# Patient Record
Sex: Female | Born: 1975 | Race: White | Hispanic: No | Marital: Married
Health system: Midwestern US, Community
[De-identification: ages and names within clinical notes are randomized; demographics above are authoritative.]

## PROBLEM LIST (undated history)

## (undated) DIAGNOSIS — Z1211 Encounter for screening for malignant neoplasm of colon: Secondary | ICD-10-CM

---

## 2014-07-06 ENCOUNTER — Inpatient Hospital Stay

## 2014-07-09 NOTE — Progress Notes (Signed)
Physical Therapy  Initial Assessment  Date: 07/09/2014  Patient Name: Mckenzie Robinson  MRN: [id number]  DOB: [birth year]     Treatment Diagnosis: Sacroilitis    Restrictions       Subjective   General  Chart Reviewed: Yes  Referring Practitioner: [name]  Referral Date : 06/22/14  Diagnosis: Sacroilitis  Follows Commands: Within Functional Limits  General Comment  Comments: see intake  forms  PT Visit Information  Onset Date: 07/09/12  PT Insurance Information: anthem  Total # of Visits Approved: 12  Subjective  Subjective: pt reports a  longhistory of right sided knee, hip and lateral thisgh pain since giving birth to first child 6 years ago.  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  Pt reports that right posterior lateral hip, thigh and calf sxs have progressd in past several months.  Described as a ache/burn that is present with all physical activity for the first t30 min then decreases.  Pt is unaware of any injury.  increased sxs with FF, squatting adn lunge motion patterns. Denies numbness or tingling, bowel or baldder sxs, no sleeping issues.  Pain Screening  Patient Currently in Pain: Yes  Pain Assessment  Pain Assessment: 0-10  Pain Level: 5  Patient's Stated Pain Goal: No pain  Vital Signs  Patient Currently in Pain: Yes  Vision/Hearing     Home Living     Orientation     Objective     Observation/Palpation  Posture: Good  Palpation: TTP of the right psoas, right posterior lateral gluteal comlpex at the GT of the right hip.  Observation: right creased low, inflair right  RLE (degrees)  RLE ROM: WFL;Exceptions  R Hip Flexion 0-125: pasive hip flex - 110  R Hip External Rotation 0-45: , seated hip ER - 35 deg.  ROM LLE (degrees)  LLE ROM: WFL  Spine  Lumbar: mild decreased reversal of the lumbar curve in FF  Special Tests: VCT: 3+/5 - yeild right anterior hips, neg. slump, neg. SLR  Joint Mobility  Spine: WNL, right SIJD, left  sacral torsion.  ROM RLE: decreased R hip ER motion pattern and posterior glide.  Strength RLE  Strength RLE: WFL  Comment: PGM: 3+/5, seated hip ER 3+/5  Strength Other  Other: supine psoas - right poor trunk response, left good.     Additional Measures  Flexibility: neg. Ober, neg. T-test bilat.   Other: PHe test: right altered firing pattern, left is WNL.  sig delay right gluteal as to the left.  Sensation  Overall Sensation Status: WNL (increased sensitivity right ITB)        Ambulation  Ambulation?: Yes  Ambulation 1  Quality of Gait: pt with decreased propulsion pattern, right withincreased ext rotation motion pattern as to the left, right "blocked " at the hip complex                            Assessment   Assessment  Assessment: Decreased functional mobility ;Decreased strength;Decreased endurance;Decreased ADL status;Decreased high-level IADLs;Decreased ROM  Assessment: pt presnets with altered muscle activation and firing pattern of the right pelvisa dn Le complex with altered biomechancis of the right SIJ, hip  Treatment Diagnosis: Sacroilitis  Prognosis: Good  Time In: 1100  Time Out: 1200  Timed Code Treatment Minutes: 60 Minutes  Total Treatment Time: 60  Activity Tolerance  Activity Tolerance: Patient Tolerated treatment well  Plan  Plan: Plan of care initiated         Plan   Plan  Plan: Plan of care initiated  Progress Note  See Progress Note: Yes  Timed Code Treatment Minutes: 60 Minutes  Total Treatment Time: 60  Frequency and duration of tx  Days: 2  Weeks: 6    G-Code       Goals  Short term goals  Time Frame for Short term goals: 1-3 weeks   Short term goal 1: independant with HEP  Short term goal 2: decreased pain to 2/10  Short term goal 3: no biomechanical deficit fo the pelvis and hip complex  Short term goal 4: increased MMT fo the PGM, seated hip ER to 4+/5  Short term goal 5: normal firing pattern right with supine psoas adn PHE  Long term goals  Time Frame for Long term goals : 3-6  weeks Pt  Long term goal 1: No pain  Long term goal 2: return to running and work out [employer] or limitation  Long term goal 3: normal VCT  Long term goal 4: Gait to be equal left to right with no ext. rotation findings on rgith stance       Gilford Raid, PT, OCS, FAAOMPT

## 2014-07-09 NOTE — Other (Signed)
Physical Therapy Daily Treatment Note    Date:  07/09/2014    Patient Name:  Mckenzie Robinson    DOB:  01-18-1976  MRN: [id number]  Restrictions/Precautions:    Medical/Treatment Diagnosis Information:  ?? Diagnosis: Sacroilitis  Insurance/Certification information:  PT Insurance Information: anthem  Physician Information:  Referring Practitioner: [name]  Plan of care signed (Y/N):    Visit# / total visits:  1/12         Time in:  11:00      Timed Treatment: 60 Total Treatment Time:  60  ________________________________________________________________________________________    Pain Level:    5/10  SUBJECTIVE:  See eval    OBJECTIVE: see eval    Exercise/Equipment Resistance/Repetitions Other comments     Clam #1                                                                                                                                        Other Therapeutic Activities:      Manual Treatments:     Right Stoddard (cavitation with positioning) left sacral torsion correction sustained grade 4, hit tech right GT  Modalities:      Test/Measurements:         ASSESSMENT:     Pt with increased PGM and seated hip ER post MT - graded near 4+/5    Treatment/Activity Tolerance:   [x]  Patient tolerated treatment well []  Patient limited by fatique  []  Patient limited by pain []  Patient limited by other medical complications  []  Other:     Goals:    Short term goals  Time Frame for Short term goals: 1-3 weeks   Short term goal 1: independant with HEP  Short term goal 2: decreased pain to 2/10  Short term goal 3: no biomechanical deficit fo the pelvis and hip complex  Short term goal 4: increased MMT fo the PGM, seated hip ER to 4+/5  Short term goal 5: normal firing pattern right with supine psoas adn PHE     Long term goals  Time Frame for Long term goals : 3-6 weeks Pt  Long term goal 1: No pain  Long term goal 2: return to running and work out [employer] sxs or limitation  Long term goal 3: normal VCT  Long term goal 4:  Gait to be equal left to right with no ext. rotation findings on rgith stance     Plan: []  Continue per plan of care []  Alter current plan (see comments)   [x]  Plan of care initiated []  Hold pending MD visit []  Discharge      Plan for Next Session:  Correction of biomechanical dysfunctions as they present on visit.  Restore proper motor firing pattern and functional muscle activation as presented on visit.  Progress as tolerates.    Re-Certification Due Date:         Signature:  Tyrell AntonioDave S  Teondra Newburg, PT, OCS, FAAOMPT

## 2014-07-24 ENCOUNTER — Inpatient Hospital Stay

## 2014-07-24 NOTE — Other (Signed)
Physical Therapy Daily Treatment Note    Date:  07/24/2014    Patient Name:  Mckenzie Robinson    DOB:  06/08/1975  MRN: [id number]  Restrictions/Precautions:    Medical/Treatment Diagnosis Information:     Insurance/Certification information:     Physician Information:  Referring Practitioner: [name]  Plan of care signed (Y/N):    Visit# / total visits:  2/12         Time in:  11:00      Timed Treatment: 30 Total Treatment Time:  30  ________________________________________________________________________________________    Pain Level:     SUBJECTIVE:  Returns following being away on vacation "I did not perform HEP as instructed"  Feeling about the same.    OBJECTIVE: SIJD, right hip with decreased ROM and strength in seated ER, positive T-test     Exercise/Equipment Resistance/Repetitions Other comments     Clam #1 reviewed      Overhead stick squat     Prone fig 4 lifts                                                                                                                          Other Therapeutic Activities:      Manual Treatments:     Right Stoddard (cavitation with positioning) left sacral torsion correction sustained grade 4, hit tech right GT  Modalities:      Test/Measurements:         ASSESSMENT:     Pt with improved multi joint motion pattern post treatment - had presented with sig quad activation pattern.    Treatment/Activity Tolerance:   [x]  Patient tolerated treatment well []  Patient limited by fatique  []  Patient limited by pain []  Patient limited by other medical complications  []  Other:     Goals:                Plan: [x]  Continue per plan of care []  Alter current plan (see comments)   []  Plan of care initiated []  Hold pending MD visit []  Discharge      Plan for Next Session:  Correction of biomechanical dysfunctions as they present on visit.  Restore proper motor firing pattern and functional muscle activation as presented on visit.  Progress as tolerates.    Re-Certification Due  Date:         Signature:  Gilford Raidave S Hondo Nanda, PT, OCS, FAAOMPT

## 2014-07-27 ENCOUNTER — Inpatient Hospital Stay

## 2014-07-27 NOTE — Other (Signed)
Physical Therapy Daily Treatment Note    Date:  07/27/2014    Patient Name:  Mckenzie Robinson    DOB:  28-Jul-1975  MRN: [id number]  Restrictions/Precautions:    Medical/Treatment Diagnosis Information:     Insurance/Certification information:     Physician Information:  Referring Practitioner: [name]  Plan of care signed (Y/N):    Visit# / total visits: 3/12         Time in:  1:00      Timed Treatment: 30 Total Treatment Time:  30  ________________________________________________________________________________________    Pain Level:     SUBJECTIVE:  States that feeling the same - ran 5 miles yesterday with same sxs.    OBJECTIVE:     Exercise/Equipment Resistance/Repetitions Other comments   reviewed                     Standing RLE - left IR holds       Multi hip machine        TG #3 SL with hip ER and single leg squatting                                                                                              Other Therapeutic Activities:      Manual Treatments:     Right Stoddard grade 4, hit tech right GT - anterior. sTM lateral thigh/posterior glut  Modalities:      Test/Measurements:         ASSESSMENT:     Pt having difficulty limiting outside exercise to allow for proper rest period      Treatment/Activity Tolerance:   [x]  Patient tolerated treatment well []  Patient limited by fatique  []  Patient limited by pain []  Patient limited by other medical complications  []  Other:     Goals:                Plan: [x]  Continue per plan of care []  Alter current plan (see comments)   []  Plan of care initiated []  Hold pending MD visit []  Discharge      Plan for Next Session:  Correction of biomechanical dysfunctions as they present on visit.  Restore proper motor firing pattern and functional muscle activation as presented on visit.  Progress as tolerates.    Re-Certification Due Date:         Signature:  Gilford Raidave S Agamjot Kilgallon, PT, OCS, FAAOMPT

## 2014-07-30 ENCOUNTER — Inpatient Hospital Stay

## 2014-07-30 NOTE — Other (Signed)
Physical Therapy Daily Treatment Note    Date:  07/30/2014    Patient Name:  Mckenzie Robinson    DOB:  1976-01-28  MRN: [id number]  Restrictions/Precautions:    Medical/Treatment Diagnosis Information:     Insurance/Certification information:     Physician Information:  Referring Practitioner: Gloris Ham  Plan of care signed (Y/N):    Visit# / total visits: 4/12         Time in:  11:00      Timed Treatment: 30 Total Treatment Time:  30  ________________________________________________________________________________________    Pain Level:     SUBJECTIVE:  Reports that having no change in upper hip "soreness"  No lateral thigh or knee pain      OBJECTIVE:     Exercise/Equipment Resistance/Repetitions Other comments   reviewed                                                                                                                        Other Therapeutic Activities:      Manual Treatments:     hit tech right GT - anterior. STM lateral thigh/posterior glut , right inflair MET, AI MET    Modalities:      Test/Measurements:         ASSESSMENT:     Pt noted decreased sxs - remains with soreness upper gluteal - glut med.      Treatment/Activity Tolerance:   '[x]'  Patient tolerated treatment well '[]'  Patient limited by fatique  '[]'  Patient limited by pain '[]'  Patient limited by other medical complications  '[]'  Other:     Goals:                Plan: '[x]'  Continue per plan of care '[]'  Alter current plan (see comments)   '[]'  Plan of care initiated '[]'  Hold pending MD visit '[]'  Discharge      Plan for Next Session:  Correction of biomechanical dysfunctions as they present on visit.  Restore proper motor firing pattern and functional muscle activation as presented on visit.  Progress as tolerates.    Re-Certification Due Date:         Signature:  Dion Body, PT, OCS, FAAOMPT

## 2014-08-01 ENCOUNTER — Inpatient Hospital Stay

## 2014-08-01 NOTE — Other (Signed)
Physical Therapy Daily Treatment Note    Date:  08/01/2014    Patient Name:  Mckenzie Robinson    DOB:  11/24/1975  MRN: [id number]  Restrictions/Precautions:    Medical/Treatment Diagnosis Information:     Insurance/Certification information:     Physician Information:  Referring Practitioner: Gloris Ham  Plan of care signed (Y/N):    Visit# / total visits: 5/12         Time in:  10:30      Timed Treatment: 40 Total Treatment Time:  40  ________________________________________________________________________________________    Pain Level:     SUBJECTIVE:  States that getting better- able to run full distance without having to stop, prior had to stop half way through.      OBJECTIVE: Right PI, left sacral torsion, TTP right proximal gluteal/hip complex    Exercise/Equipment Resistance/Repetitions Other comments   reviewed                                        Roller ITB/piriformis     Roller protocol        Hip ER - PNF, sustained holds                                                               Other Therapeutic Activities:      Manual Treatments:     left Stoddard grade 4,  left sacral torsion correction sustained grade 4, hit tech right GT - anterior and posterior. STM lateral thigh/posterior glut , right inflair MET,     Modalities:      Test/Measurements:         ASSESSMENT:    Progressing well -       Treatment/Activity Tolerance:   [x] Patient tolerated treatment well [] Patient limited by fatique  [] Patient limited by pain [] Patient limited by other medical complications  [] Other:     Goals:                Plan: [x] Continue per plan of care [] Alter current plan (see comments)   [] Plan of care initiated [] Hold pending MD visit [] Discharge      Plan for Next Session:  Correction of biomechanical dysfunctions as they present on visit.  Restore proper motor firing pattern and functional muscle activation as presented on visit.  Progress as tolerates.    Re-Certification Due Date:         Signature:   Dion Body, PT, OCS, FAAOMPT

## 2014-08-06 ENCOUNTER — Inpatient Hospital Stay

## 2014-08-06 NOTE — Other (Signed)
Physical Therapy Daily Treatment Note    Date:  08/06/2014    Patient Name:  Mckenzie Robinson    DOB:  1975/06/10  MRN: [id number]  Restrictions/Precautions:    Medical/Treatment Diagnosis Information:     Insurance/Certification information:     Physician Information:  Referring Practitioner: [name]  Plan of care signed (Y/N):    Visit# / total visits: 6/12         Time in:  11:00      Timed Treatment: 30 Total Treatment Time:  30  ________________________________________________________________________________________    Pain Level:     SUBJECTIVE:  Reports that having decreased sxs overall but sxs remains especially after running and spinning for 50 minutes.        OBJECTIVE: remains with decreased mobility of the femoral head (right) and altered sacral position    Exercise/Equipment Resistance/Repetitions Other comments   TG in left side lying and hip ER single leg press     Ball [place] lifts                                               Hip ER - PNF, sustained holds                                                               Other Therapeutic Activities:      Manual Treatments:     left sacral torsion correction sustained grade 4, hit (anterior) tech right GT  STM lateral thigh/posterior glut ,      Modalities:      Test/Measurements:         ASSESSMENT:    Progressing well - see progress note      Treatment/Activity Tolerance:   [x]  Patient tolerated treatment well []  Patient limited by fatique  []  Patient limited by pain []  Patient limited by other medical complications  []  Other:     Goals:                Plan: [x]  Continue per plan of care []  Alter current plan (see comments)   []  Plan of care initiated []  Hold pending MD visit []  Discharge      Plan for Next Session:  Correction of biomechanical dysfunctions as they present on visit.  Restore proper motor firing pattern and functional muscle activation as presented on visit.  Progress as tolerates.    Re-Certification Due Date:         Signature:   Gilford Raidave S Zaccheus Edmister, PT, OCS, FAAOMPT

## 2014-08-06 NOTE — Progress Notes (Signed)
Outpatient Physical Therapy  Phone: [phone] Fax: [phone]     To: Dr. Dory Peruhung      From: Gilford Raidave S Kerah Hardebeck, PT, OCS, FAAOMPT    Patient: Mckenzie Robinson     DOB: [birth year]  Diagnosis: SIJD/Ritght ITB    Date: 08/06/2014       Physical Therapy Progress    Total Visits to date:  6  Cancels/No-shows to date:  0    Plan of Care/Treatment to date:  [x]  Therapeutic Exercise    []  Modalities:  []  Therapeutic Activity     []  Ultrasound   []  Electrical Stimulation   [x]  Gait Training      []  Cervical Traction   []  Lumbar Traction  [x]  Neuromuscular Re-education  []  Cold/hotpack  []  Iontophoresis  [x]  Instruction in HEP      Other:  [x]  Manual Therapy       []                                  []  Aquatic Therapy       []                                ?      Significant Findings At Last Visit/Comments:    ?? Notes improved capacity to run prior to onset of sxs.  When sxs return, they are of decreased intensity  ?? Mild sxs after spinning class of greater than 50 minutes  ?? Improved pelvic alignment and mobility on the right, remains with sacral torsion  ?? Sig. Increased right hip motion pattern and muscle activation (decreased tensor motion pattern/femoral IR)    Plan to continue to see Mrs. Bowerman for restoration of biomechanical deficits and facilitation of proper muscle activation and firing pattern of the right LE and pelvis.              Rehab Potential: []  Excellent [x]  Good []  Fair  []  Poor     Goal Status:  []  Achieved [x]  Partially Achieved  []  Not Achieved     Patient Status: [x]  Continue per initial plan of Care     []  Patient now discharged     []  Additional visits requested, Please re-certify for additional visits:      Requested frequency/duration:  X/week for weeks    Electronically signed by:  Gilford Raidave S Ephrem Carrick, PT, OCS, FAAOMPT    If you have any questions or concerns, please don't hesitate to call.  Thank you for your referral.    Physician  Signature:________________________________Date:__________________  By signing above, therapist???s plan is approved by physician

## 2014-08-10 ENCOUNTER — Inpatient Hospital Stay

## 2014-08-10 NOTE — Other (Signed)
Physical Therapy Daily Treatment Note    Date:  08/10/2014    Patient Name:  Mckenzie Robinson    DOB:  1975-06-15  MRN: [id number]  Restrictions/Precautions:    Medical/Treatment Diagnosis Information:     Insurance/Certification information:     Physician Information:  Referring Practitioner: Mckenzie Robinson  Plan of care signed (Y/N):    Visit# / total visits: 7/12         Time in:  11:10      Timed Treatment: 15 Total Treatment Time:  20  ________________________________________________________________________________________    Pain Level:     SUBJECTIVE:  (confusion with scheduling and check in on today's visit - hence shorter visit.      OBJECTIVE:     Exercise/Equipment Resistance/Repetitions Other comments                                                                                                                    Other Therapeutic Activities:      Manual Treatments:     left sacral torsion correction sustained grade 4, hit (anterior) tech right GT  STM lateral thigh/posterior glut ,  Stoddard (cavitation with positioning), lateral hip distraction grade 4, STM of the ITB and peri GT.  Modalities:      Test/Measurements:         ASSESSMENT:    Progressing well, pt on vacation for 1 week.      Treatment/Activity Tolerance:   [x]  Patient tolerated treatment well []  Patient limited by fatique  []  Patient limited by pain []  Patient limited by other medical complications  []  Other:     Goals:                Plan: [x]  Continue per plan of care []  Alter current plan (see comments)   []  Plan of care initiated []  Hold pending MD visit []  Discharge      Plan for Next Session:  Correction of biomechanical dysfunctions as they present on visit.  Restore proper motor firing pattern and functional muscle activation as presented on visit.  Progress as tolerates.    Re-Certification Due Date:         Signature:  Gilford Raidave S Georga Stys, PT, OCS, FAAOMPT

## 2014-08-10 NOTE — Progress Notes (Deleted)
Physical Therapy  Cancellation/No-show Note  Patient Name:  Mckenzie LoftMargaret M Braunschweig  DOB:  27-Nov-1975   Date:  08/10/2014  Cancels to date: 0  No-shows to date: 1    For today's appointment patient:  []  Cancelled  []  Rescheduled appointment  [x]  No-show     Reason given by patient:  []  Patient ill  []  Conflicting appointment  []  No transportation    []  Conflict with work  [x]  No reason given  []  Other:     Comments:      Electronically signed by:  Gilford Raidave S Anglia Blakley, PT

## 2014-08-22 ENCOUNTER — Inpatient Hospital Stay

## 2014-08-22 NOTE — Progress Notes (Signed)
Outpatient Physical Therapy  Phone: [phone] Fax: [phone]     To: Dr. Earlie LouJ. Chung      From: Mckenzie Robinson, PT, OCS, FAAOMPT     Patient: Mckenzie Robinson      DOB: Jun 28, 1975  Diagnosis: SIJD, ITB      Date: 08/22/2014    Physical Therapy Progress/Discharge Note    Total Visits to date:  8  Cancels/No-shows to date: 0     Plan of Care/Treatment to date:  [x]  Therapeutic Exercise    []  Modalities:  []  Therapeutic Activity     []  Ultrasound   []  Electrical Stimulation   [x]  Gait Training      []  Cervical Traction   []  Lumbar Traction  [x]  Neuromuscular Re-education  []  Cold/hotpack  []  Iontophoresis  [x]  Instruction in HEP      Other:  [x]  Manual Therapy       []                                  []  Aquatic Therapy       []                                ?            Findings:    ?? Increased ability to perform exercise activity with less frequency and intensity of lateral thigh sxs.   ?? Pelvic asymmetry with increasing ability to sustain  manual corrections - remains with on/off right Posterior innominata and left sacral torsion.  ?? Sig. Increased strength of the right short hip ER - but they remains with early fatigue leading to increased use of the tensor/ITB in high dynamic activity  ?? 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           Rehab Potential: [x]  Excellent []  Good []  Fair  []  Poor     Goal Status:  []  Achieved [x]  Partially Achieved  []  Not Achieved     Patient Status: [x]  Continue per initial plan of Care     []  Patient now discharged     [x]  Additional visits requested, Please re-certify for additional visits:      Requested frequency/duration:  1X/week for 4 weeks    Electronically signed by:  Mckenzie Raidave S Ronit Marczak, PT, OCS, FAAOMPT    If you have any questions or concerns, please don't hesitate to call.  Thank you for your referral.    Physician  Signature:________________________________Date:__________________  By signing above, therapist???s plan is approved by physician

## 2014-08-22 NOTE — Other (Signed)
Physical Therapy Daily Treatment Note    Date:  08/22/2014    Patient Name:  Mckenzie Robinson    DOB:  05/29/1975  MRN: [id number]  Restrictions/Precautions:    Medical/Treatment Diagnosis Information:   ITB   Insurance/Certification information:     Physician Information:  Referring Practitioner: [name]  Plan of care signed (Y/N):    Visit# / total visits: 9/12         Time in:  12:30      Timed Treatment: 30 Total Treatment Time:  30  ________________________________________________________________________________________    Pain Level:     SUBJECTIVE:  Reports that did not do much HEP on vacation - was able to use TM twice without sxs.      OBJECTIVE: decreased soft tissue mobility of the right muscle at the insertion of the GT.  Right PI and scaral torsion noted.    Exercise/Equipment Resistance/Repetitions Other comments                                                                                                                    Other Therapeutic Activities:      Manual Treatments:     left sacral torsion correction sustained grade 4, hit (anterior) tech right GT  STM lateral thigh/posterior glut ,  Stoddard grade 5 right, lateral hip distraction grade 4, STM of the ITB and peri GT.  Modalities:      Test/Measurements:         ASSESSMENT:    Per MD note      Treatment/Activity Tolerance:   [x]  Patient tolerated treatment well []  Patient limited by fatique  []  Patient limited by pain []  Patient limited by other medical complications  []  Other:       Plan: [x]  Continue per plan of care []  Alter current plan (see comments)   []  Plan of care initiated []  Hold pending MD visit []  Discharge      Plan for Next Session:  Correction of biomechanical dysfunctions as they present on visit.  Restore proper motor firing pattern and functional muscle activation as presented on visit.  Progress as tolerates.    Re-Certification Due Date:         Signature:  Gilford Raidave S Kert Shackett, PT, OCS, FAAOMPT

## 2014-08-30 ENCOUNTER — Encounter

## 2014-08-31 ENCOUNTER — Encounter

## 2014-09-03 ENCOUNTER — Inpatient Hospital Stay

## 2014-09-03 NOTE — Progress Notes (Signed)
New Rx dated 08-23-14 4 visits

## 2014-09-03 NOTE — Other (Signed)
Physical Therapy Daily Treatment Note    Date:  09/03/2014    Patient Name:  Mckenzie Robinson    DOB:  05-16-1976  MRN: [id number]  Restrictions/Precautions:    Medical/Treatment Diagnosis Information:   ITB   Insurance/Certification information:     Physician Information:  Referring Practitioner: [name]  Plan of care signed (Y/N):    Visit# / total visits: 9/12 (4)         Time in:  3:00     Timed Treatment: 30 Total Treatment Time:  30  ________________________________________________________________________________________    Pain Level:     SUBJECTIVE:  Reports that getting better all the time - MD happy with progress per pt.      OBJECTIVE: positive ANTT RLE    Exercise/Equipment Resistance/Repetitions Other comments                PKF with ball squeeze     3 way ball compression holds with mini bridge     Slump slidders                                                                                          Other Therapeutic Activities:      Manual Treatments:     Stoddard, positional only, standing drop , STM right gluteal complex, prone long axis distraction grade 4, sciatic nerve tracing, ANTT RLE tensioner's and sliders       Modalities:      Test/Measurements:         ASSESSMENT:    Per MD note      Treatment/Activity Tolerance:   [x]  Patient tolerated treatment well []  Patient limited by fatique  []  Patient limited by pain []  Patient limited by other medical complications  []  Other:       Plan: [x]  Continue per plan of care []  Alter current plan (see comments)   []  Plan of care initiated []  Hold pending MD visit []  Discharge      Plan for Next Session:  Correction of biomechanical dysfunctions as they present on visit.  Restore proper motor firing pattern and functional muscle activation as presented on visit.  Progress as tolerates.    Re-Certification Due Date:         Signature:  Gilford Raidave S Kanijah Groseclose, PT, OCS, FAAOMPT

## 2014-09-11 ENCOUNTER — Inpatient Hospital Stay

## 2014-09-11 NOTE — Other (Signed)
Physical Therapy Daily Treatment Note    Date:  09/11/2014    Patient Name:  Mckenzie Robinson    DOB:  10/20/1975  MRN: [id number]  Restrictions/Precautions:    Medical/Treatment Diagnosis Information:   ITB   Insurance/Certification information:     Physician Information:  Referring Practitioner: [name]  Plan of care signed (Y/N):    Visit# / total visits: 10/12 (4)         Time in:  10:30     Timed Treatment: 30 Total Treatment Time:  30  ________________________________________________________________________________________    Pain Level:     SUBJECTIVE:  Reports that overall sxs are improved no w with no sxs in the lateral thigh - primarily in the high ischial tuberosity area.  Had one episode of right calf sxs with running 4 days ago.      OBJECTIVE: right AI, positive ANTT right, positive slump test right.    Exercise/Equipment Resistance/Repetitions Other comments                        Slump slidders                                                                                          Other Therapeutic Activities:      Manual Treatments:     Stoddard grade 5, , STM right gluteal complex, ischial tuberopsity,, sciatic nerve tracing, ANTT RLE tensioner's and sliders       Modalities:      Test/Measurements:         ASSESSMENT:    Note no sxs post treatment, neg slump  test      Treatment/Activity Tolerance:   [x]  Patient tolerated treatment well []  Patient limited by fatique  []  Patient limited by pain []  Patient limited by other medical complications  []  Other:       Plan: [x]  Continue per plan of care []  Alter current plan (see comments)   []  Plan of care initiated []  Hold pending MD visit []  Discharge      Plan for Next Session:  Correction of biomechanical dysfunctions as they present on visit.  Restore proper motor firing pattern and functional muscle activation as presented on visit.  Progress as tolerates.    Re-Certification Due Date:         Signature:  Gilford Raidave S Homero Hyson, PT, OCS, FAAOMPT

## 2014-09-17 ENCOUNTER — Inpatient Hospital Stay

## 2014-09-17 NOTE — Other (Signed)
Physical Therapy Daily Treatment Note    Date:  09/17/2014    Patient Name:  Mckenzie Robinson    DOB:  [birth year]  MRN: [id number]  Restrictions/Precautions:    Medical/Treatment Diagnosis Information:   ITB   Insurance/Certification information:     Physician Information:  Referring Practitioner: [name]  Plan of care signed (Y/N):    Visit# / total visits: 11/12 (4)         Time in:  11:00     Timed Treatment: 30 Total Treatment Time:  30  ________________________________________________________________________________________    Pain Level:     SUBJECTIVE: Reports that doing much better overall - minimal sxs with last two runs..      OBJECTIVE:  positive ANTT right, positive slump test right. TTP at the right gluteal triangle.     Exercise/Equipment Resistance/Repetitions Other comments                        Slump slidders Reviewed                                                                                          Other Therapeutic Activities:      Manual Treatments:     Stoddard grade 5, , STM right gluteal complex, ischial tuberopsity,, sciatic nerve tracing, ANTT RLE tensioner's and sliders , Mob. T4-T10 grade 5.      Modalities:      Test/Measurements:         ASSESSMENT:    Neg slump post visit.        Treatment/Activity Tolerance:   [x]  Patient tolerated treatment well []  Patient limited by fatique  []  Patient limited by pain []  Patient limited by other medical complications  []  Other:       Plan: [x]  Continue per plan of care []  Alter current plan (see comments)   []  Plan of care initiated []  Hold pending MD visit []  Discharge      Plan for Next Session:  Correction of biomechanical dysfunctions as they present on visit.  Restore proper motor firing pattern and functional muscle activation as presented on visit.  Progress as tolerates.    Re-Certification Due Date:         Signature:  Gilford Raidave S Tamim Skog, PT, OCS, FAAOMPT

## 2014-09-24 ENCOUNTER — Inpatient Hospital Stay

## 2014-09-24 NOTE — Other (Signed)
Physical Therapy Daily Treatment Note    Date:  09/24/2014    Patient Name:  Mckenzie Robinson    DOB:  24-Jun-1975  MRN: [id number]  Restrictions/Precautions:    Medical/Treatment Diagnosis Information:   ITB   Insurance/Certification information:     Physician Information:  Referring Practitioner: [name]  Plan of care signed (Y/N):    Visit# / total visits: 12/12 (4)         Time in:  11:00     Timed Treatment: 30 Total Treatment Time:  30  ________________________________________________________________________________________    Pain Level:     SUBJECTIVE: continues to be of decreased sxs - reports that not to complient with HEP      OBJECTIVE:  positive ANTT right, positive slump test right. TTP at the right gluteal triangle.     Exercise/Equipment Resistance/Repetitions Other comments                        Slump slidders Reviewed                                                                                          Other Therapeutic Activities:      Manual Treatments:     Stoddard grade 5, , STM right gluteal complex, ischial tuberopsity,, sciatic nerve tracing, ANTT RLE tensioner's and sliders , Mob. T4-T10 grade 5.      Modalities:      Test/Measurements:         ASSESSMENT:      Progressing well. See 1-2 visits prior to MD recheck      Treatment/Activity Tolerance:   [x]  Patient tolerated treatment well []  Patient limited by fatique  []  Patient limited by pain []  Patient limited by other medical complications  []  Other:       Plan: [x]  Continue per plan of care []  Alter current plan (see comments)   []  Plan of care initiated []  Hold pending MD visit []  Discharge      Plan for Next Session:  Correction of biomechanical dysfunctions as they present on visit.  Restore proper motor firing pattern and functional muscle activation as presented on visit.  Progress as tolerates.    Re-Certification Due Date:         Signature:  Gilford Raidave S Donnovan Stamour, PT, OCS, FAAOMPT

## 2014-10-02 ENCOUNTER — Inpatient Hospital Stay

## 2014-10-02 NOTE — Progress Notes (Signed)
Physical Therapy  Cancellation/No-show Note  Patient Name:  Mckenzie Robinson  DOB:  09/09/1975   Date:  10/02/2014  Cancels to date: 1  No-shows to date: 0    For today's appointment patient:  [x]  Cancelled  []  Rescheduled appointment  []  No-show     Reason given by patient:  []  Patient ill  [x]  Conflicting appointment  []  No transportation    []  Conflict with work  []  No reason given  []  Other:     Comments:      Electronically signed by:  Gilford Raidave S Evian Derringer, PT

## 2014-10-08 ENCOUNTER — Inpatient Hospital Stay

## 2014-10-08 NOTE — Other (Signed)
Physical Therapy Daily Treatment Note    Date:  10/08/2014    Patient Name:  Mckenzie Robinson    DOB:  1975/05/29  MRN: [id number]  Restrictions/Precautions:    Medical/Treatment Diagnosis Information:   ITB   Insurance/Certification information:     Physician Information:  Referring Practitioner: [name]  Plan of care signed (Y/N):    Visit# / total visits: 12/12 (5)         Time in:  11:30     Timed Treatment: 25 Total Treatment Time:  25  ________________________________________________________________________________________    Pain Level:     SUBJECTIVE: Reports that only intermittent sxs in the right calf with high impact activity. States that has not been fully compliant with HEP as prescribed.       OBJECTIVE:  Remains with mild ANTT RLE .  Hip AROM equal to the left, no SIJD, closed L5-S1.    Exercise/Equipment Resistance/Repetitions Other comments                        Slump slidders Reviewed                                                                                          Other Therapeutic Activities:      Manual Treatments:     STM right gluteal complex - peri greater trochanter, ischial tuberopsity,, sciatic nerve tracing, ANTT RLE tensioner's and sliders , Mob. T4-T10 grade 5. Open L5-S1 grade 4.      Modalities:      Test/Measurements:         ASSESSMENT:      To see MD this Friday.  Recommend holding PT, pt. to re-initiate HEP, primarily neural glides and hold PT at present time unless sxs return unless MD recommends to continue.      Treatment/Activity Tolerance:   [x]  Patient tolerated treatment well []  Patient limited by fatique  []  Patient limited by pain []  Patient limited by other medical complications  []  Other:       Plan: []  Continue per plan of care []  Alter current plan (see comments)   []  Plan of care initiated [x]  Hold pending MD visit []  Discharge      Plan for Next Session:  Correction of biomechanical dysfunctions as they present on visit.  Restore proper motor firing  pattern and functional muscle activation as presented on visit.  Progress as tolerates.    Re-Certification Due Date:         Signature:  Gilford Raidave S Jaequan Propes, PT, OCS, FAAOMPT

## 2014-10-16 NOTE — Progress Notes (Signed)
Scheduling error.  Pt was to have been taken off schedule for today's apt.    Mckenzie Robinson, PT

## 2014-10-17 ENCOUNTER — Inpatient Hospital Stay

## 2014-10-22 ENCOUNTER — Encounter

## 2014-10-29 ENCOUNTER — Inpatient Hospital Stay

## 2014-10-29 NOTE — Progress Notes (Signed)
Pt should not have been scheduled for apt.  Now DC form schedule.  Yevonne Pax, PT

## 2020-12-30 ENCOUNTER — Inpatient Hospital Stay: Payer: PRIVATE HEALTH INSURANCE

## 2020-12-30 ENCOUNTER — Ambulatory Visit: Admit: 2020-12-30

## 2020-12-30 LAB — POC PREGNANCY UR-QUAL: Pregnancy, Urine: NEGATIVE

## 2020-12-30 MED ORDER — LACTATED RINGERS IV SOLN
INTRAVENOUS | Status: DC
Start: 2020-12-30 — End: 2020-12-30
  Administered 2020-12-30: 12:00:00 via INTRAVENOUS

## 2020-12-30 MED ORDER — LIDOCAINE HCL (CARDIAC) PF 100 MG/5ML IV SOLN
100 | INTRAVENOUS | Status: AC
Start: 2020-12-30 — End: 2020-12-30

## 2020-12-30 MED ORDER — NORMAL SALINE FLUSH 0.9 % IV SOLN
0.9 % | Freq: Two times a day (BID) | INTRAVENOUS | Status: DC
Start: 2020-12-30 — End: 2020-12-30

## 2020-12-30 MED ORDER — NORMAL SALINE FLUSH 0.9 % IV SOLN
0.9 % | INTRAVENOUS | Status: DC | PRN
Start: 2020-12-30 — End: 2020-12-30

## 2020-12-30 MED ORDER — LIDOCAINE HCL (CARDIAC) PF 100 MG/5ML IV SOLN
100 MG/5ML | INTRAVENOUS | Status: DC | PRN
Start: 2020-12-30 — End: 2020-12-30
  Administered 2020-12-30: 13:00:00 100 via INTRAVENOUS

## 2020-12-30 MED ORDER — PROPOFOL 200 MG/20ML IV EMUL
20020 MG/20ML | INTRAVENOUS | Status: DC | PRN
Start: 2020-12-30 — End: 2020-12-30
  Administered 2020-12-30: 13:00:00 150 via INTRAVENOUS

## 2020-12-30 MED ORDER — PROPOFOL 200 MG/20ML IV EMUL
200 | INTRAVENOUS | Status: DC | PRN
Start: 2020-12-30 — End: 2020-12-30
  Administered 2020-12-30: 13:00:00 20 via INTRAVENOUS
  Administered 2020-12-30: 13:00:00 50 via INTRAVENOUS
  Administered 2020-12-30: 13:00:00 30 via INTRAVENOUS

## 2020-12-30 MED ORDER — SODIUM CHLORIDE 0.9 % IV SOLN
0.9 | INTRAVENOUS | Status: DC | PRN
Start: 2020-12-30 — End: 2020-12-30

## 2020-12-30 MED ORDER — PROPOFOL 200 MG/20ML IV EMUL
200 | INTRAVENOUS | Status: AC
Start: 2020-12-30 — End: 2020-12-30

## 2020-12-30 MED ORDER — LACTATED RINGERS IV SOLN
INTRAVENOUS | Status: DC | PRN
Start: 2020-12-30 — End: 2020-12-30
  Administered 2020-12-30: 13:00:00 via INTRAVENOUS

## 2020-12-30 MED FILL — LIDOCAINE HCL (CARDIAC) PF 100 MG/5ML IV SOLN: 100 MG/5ML | INTRAVENOUS | Qty: 5

## 2020-12-30 MED FILL — DIPRIVAN 200 MG/20ML IV EMUL: 200 MG/20ML | INTRAVENOUS | Qty: 60

## 2020-12-30 NOTE — H&P (Signed)
[facility] ENDOSCOPY  Outpatient Procedure H&P    Patient: Mckenzie Robinson MRN: [id number]     Date of Birth: 1975/09/04  Age: 45 y.o.  Sex: female    Unit: [facility] ENDOSCOPY Room/Bed: Endo Pool/NONE Location: [place]     Procedure: Procedure(s):  COLONOSCOPY    Indication: Family history of colon cancer [Z80.0]    Referring  Physician:          Nurses past medical history notes reviewed and agreed.   Medications reviewed.    Allergies: Patient has no known allergies.     Allergies noted: Yes     Past Medical History: History reviewed. No pertinent past medical history.    Past Surgical History:   Past Surgical History:   Procedure Laterality Date    CESAREAN SECTION      CHOLECYSTECTOMY      LASIK      WISDOM TOOTH EXTRACTION         Social History:   Social History     Socioeconomic History    Marital status: Married     Spouse name: Not on file    Number of children: Not on file    Years of education: Not on file    Highest education level: Not on file   Occupational History    Not on file   Tobacco Use    Smoking status: Never    Smokeless tobacco: Never   Substance and Sexual Activity    Alcohol use: Yes    Drug use: Not Currently     Types: Marijuana Sherrie Mustache)    Sexual activity: Not on file   Other Topics Concern    Not on file   Social History Narrative    Not on file     Social Determinants of Health     Financial Resource Strain: Not on file   Food Insecurity: Not on file   Transportation Needs: Not on file   Physical Activity: Not on file   Stress: Not on file   Social Connections: Not on file   Intimate Partner Violence: Not on file   Housing Stability: Not on file       Family History: History reviewed. No pertinent family history.    Home Medications:   Prior to Admission medications    Not on File       Review of Systems:  Weight Loss: No  Dysphagia: No  Dyspepsia: No  Melena: no  Chest pain: no    Physical Exam:   Vital Signs: BP (!) 124/93    Pulse 69    Temp 97.3 ??F (36.3 ??C) (Temporal)     Resp 16    Ht _0  (1.702 m)    Wt 137 lb (62.1 kg)    LMP 12/18/2020    SpO2 100%    BMI 21.46 kg/m??   Vital signs reviewed:Yes    HEENT:Normal  Cardiac:Normal  Chest:Normal  Abdomen:Normal  Exts: Normal  Neuro:Normal    Labs:  Admission on 12/30/2020   Component Date Value Ref Range Status    Pregnancy, Urine 12/30/2020 Negative  Detects HCG level >25 MIU/mL Final        Imaging:  No orders to display       ASA:1    Mallampati Score: II    Sedation planned: MAC    Patient in acceptable condition for procedure:Yes    8:37 AM 12/30/2020    Hunt Oris, MD  Please note that some or all of this record was generated using voice recognition software. If there are any questions about the content of this document, please contact the author as some errors in transcription may have occurred.

## 2020-12-30 NOTE — Op Note (Signed)
Colonoscopy Procedure Note    Patient: Mckenzie Robinson MRN: [id number]     Date of Birth: [birth year]  Age: 45 y.o.  Sex: female    Unit: [facility] ENDOSCOPY Room/Bed: Endo Pool/NONE Location: [facility]       Colonoscopy    Admitting Physician: Gregary Cromer     Primary Care Physician: Earney [facility]      Preoperative Diagnosis: Family history of colon cancer [Z80.0]      DATE OF OPERATION: 12/30/2020    OPERATIVE SURGEON: Gregary Cromer, MD      ANESTHESIA: Monitor Anesthesia Care      Procedure Details:    After informed consent was obtained with all risks and benefits of procedure explained and preoperative exam completed, the patient was taken to the endoscopy suite and placed in the left lateral decubitus position. Upon sequential sedation as per above, a digital rectal exam was performed and was normal.  The Olympus videocolonoscope  was inserted in the rectum and carefully advanced to the ceum. Cecum Intubated Yes. The quality of preparation was fair.  The colonoscope was slowly withdrawn with careful evaluation between folds. Retroflexion in the rectum was performed.      Estimated Blood Loss: minimal    Complications:  none    Findings:   - Normal colonoscopy within limitations of fair preparation.    Plan: Repeat colonoscopy in 5 years with 2-day preparation.        Signed By: Gregary Cromer, MD

## 2020-12-30 NOTE — Progress Notes (Signed)
Discharge instructions reviewed with patient/responsible adult and understanding verbalized. Discharge instructions signed and copies given. Patient discharged home with belongings. Pt left amb to go home with her spouse, No c/o

## 2020-12-30 NOTE — Other (Signed)
Taken at 0900

## 2020-12-30 NOTE — Discharge Instructions (Addendum)
ENDOSCOPY DISCHARGE INSTRUCTIONS:    Call the physician that did your procedure for any questions or concern:    [facility]: [phone]  DR. CARMEN MEIER          ACTIVITY:    There are potential side effects to the medications used for sedation and anesthesia during your procedure.  These include:  Dizziness or light-headedness, confusion or memory loss, delayed reaction times, loss of coordination, nausea and vomiting.  Because of your increased risk for injury, we ask that you observe the following precautions:  For the next 24 hours,  DO NOT operate an automobile, bicycle, motorcycle, lawn mower, power tools or large equipment of any kind.  Do not drink alcohol, sign any legal documents or make any legal decisions for 24 hours.  Do not bend your head over lower than your heart.  DO sit on the side of bed/couch awhile before getting up.  Plan on bedrest or quiet relaxation today.  You may resume normal activities in 24 hours.    DIET:    Your first meal today should be light, avoiding spicy and fatty foods.  If you tolerate this first meal, then you may advance to your regular diet unless otherwise advised by your physician.    NORMAL SYMPTOMS:  -Mild sore throat if you???ve had an EGD   -Gaseous discomfort    NOTIFY YOUR PHYSICIAN IF THESE SYMPTOMS OCCUR:  1. Fever (greater than 100)  5. Increased abdominal bloating  2. Severe pain    6. Excessive bleeding  3. Nausea and vomiting  7. Chest pain                                                                    4. Chills    8. Shortness of breath    ADDITIONAL INSTRUCTIONS:    Biopsy results: none  Educational Information:      Findings:   - Normal colonoscopy within limitations of fair preparation.     Plan: Repeat colonoscopy in 5 years with 2-day preparation.    Please review these discharge instructions this evening or tomorrow for  information you may have forgotten.            We want to thank you for choosing the [facility] as your health  care provider. We always strive to provide you with excellent care while you are here. You may receive a survey in the mail regarding your care. We would appreciate you taking a few minutes of your time to complete this survey.

## 2020-12-30 NOTE — Anesthesia Post-Procedure Evaluation (Signed)
Department of Anesthesiology  Postprocedure Note    Patient: Mckenzie Robinson  MRN: [id number]  Birthdate: June 11, 1975  Date of evaluation: 12/30/2020      Procedure Summary     Date: 12/30/20 Room / Location: [facility] ENDO 04 / [facility]    Anesthesia Start: [phone] Anesthesia Stop: 3837    Procedure: COLONOSCOPY Diagnosis:       Family history of colon cancer      (Family history of colon cancer [Z80.0])    Surgeons: Hunt Oris, MD Responsible Provider: Adrian Prows, MD    Anesthesia Type: MAC ASA Status: 1          Anesthesia Type: No value filed.    Aldrete Phase I: Aldrete Score: 10    Aldrete Phase II: Aldrete Score: 10      Anesthesia Post Evaluation    Patient location during evaluation: bedside  Level of consciousness: awake and alert  Airway patency: patent  Nausea & Vomiting: no vomiting  Complications: no  Cardiovascular status: blood pressure returned to baseline  Respiratory status: acceptable  Hydration status: euvolemic  Multimodal analgesia pain management approach

## 2020-12-30 NOTE — Anesthesia Pre-Procedure Evaluation (Signed)
Department of Anesthesiology  Preprocedure Note       Name:  Mckenzie Robinson   Age:  45 y.o.  DOB:  11-27-1975                                          MRN:  [id number]         Date:  12/30/2020      Surgeon: Juliann Mule):  Hunt Oris, MD    Procedure: Procedure(s):  COLONOSCOPY    Medications prior to admission:   Prior to Admission medications    Not on File       Current medications:    Current Facility-Administered Medications   Medication Dose Route Frequency Provider Last Rate Last Admin   ??? lactated ringers infusion   IntraVENous Continuous Judie Bonus, MD       ??? sodium chloride flush 0.9 % injection 5-40 mL  5-40 mL IntraVENous 2 times per day Judie Bonus, MD       ??? sodium chloride flush 0.9 % injection 5-40 mL  5-40 mL IntraVENous PRN Judie Bonus, MD       ??? 0.9 % sodium chloride infusion   IntraVENous PRN Judie Bonus, MD           Allergies:  No Known Allergies    Problem List:  There is no problem list on file for this patient.      Past Medical History:  History reviewed. No pertinent past medical history.    Past Surgical History:  History reviewed. No pertinent surgical history.    Social History:    Social History     Tobacco Use   ??? Smoking status: Not on file   ??? Smokeless tobacco: Not on file   Substance Use Topics   ??? Alcohol use: Not on file                                Counseling given: Not Answered      Vital Signs (Current):   Vitals:    12/30/20 0754   BP: (!) 124/93   Pulse: 69   Resp: 16   Temp: 97.3 ??F (36.3 ??C)   TempSrc: Temporal   SpO2: 100%   Weight: 137 lb (62.1 kg)   Height: _0  (1.702 m)                                              BP Readings from Last 3 Encounters:   12/30/20 (!) 124/93       NPO Status:                                                                                 BMI:   Wt Readings from Last 3 Encounters:   12/30/20 137 lb (62.1 kg)     Body mass index is 21.46 kg/m??.  CBC: No results found for: WBC, RBC, HGB, HCT, MCV, RDW, PLT    CMP: No  results found for: NA, K, CL, CO2, BUN, CREATININE, GFRAA, AGRATIO, LABGLOM, GLUCOSE, GLU, PROT, CALCIUM, BILITOT, ALKPHOS, AST, ALT    POC Tests: No results for input(s): POCGLU, POCNA, POCK, POCCL, POCBUN, POCHEMO, POCHCT in the last 72 hours.    Coags: No results found for: PROTIME, INR, APTT    HCG (If Applicable): No results found for: PREGTESTUR, PREGSERUM, HCG, HCGQUANT     ABGs: No results found for: PHART, PO2ART, PCO2ART, HCO3ART, BEART, O2SATART     Type & Screen (If Applicable):  No results found for: LABABO, LABRH    Drug/Infectious Status (If Applicable):  No results found for: HIV, HEPCAB    COVID-19 Screening (If Applicable): No results found for: COVID19        Anesthesia Evaluation  Patient summary reviewed  Airway: Mallampati: I  TM distance: >3 FB   Neck ROM: full  Mouth opening: > = 3 FB   Dental:          Pulmonary:Negative Pulmonary ROS breath sounds clear to auscultation                             Cardiovascular:Negative CV ROS            Rhythm: regular  Rate: normal           Beta Blocker:  Not on Beta Blocker         Neuro/Psych:   Negative Neuro/Psych ROS              GI/Hepatic/Renal: Neg GI/Hepatic/Renal ROS            Endo/Other: Negative Endo/Other ROS                    Abdominal:             Vascular: negative vascular ROS.         Other Findings:           Anesthesia Plan      MAC     ASA 1       Induction: intravenous.      Anesthetic plan and risks discussed with patient.      Plan discussed with CRNA.                    Adrian Prows, MD   12/30/2020

## 2023-05-07 IMAGING — CT CT STROKE PROTOCOL
2 of 4 series · 15 of 40 positions shown, 18 images · non-contrast
Comparison: 

Addendum:
(#[id number]
\F\[HOSPITAL]\F\
Communicated to: Dr. Nazareth Jumper
On behalf of: Dr. Ademir Ota
By: Polin Billiot
At: [DATE]
On: 05/07/2023 EST
\F\/[HOSPITAL]\F\#)
FINAL REPORT:
CLINICAL HISTORY: Neuro deficit
Exam: CT of the head without contrast
TECHNIQUE: Axial imaging obtained from the skull base to the vertex without contrast. Coronal and sagittal imaging obtained.

[Series 4: thins · axial · 0.49mm/px · z∈[+17,+154]mm · 12 of 256 slices shown, 15 images]
[im 20/256  brain]
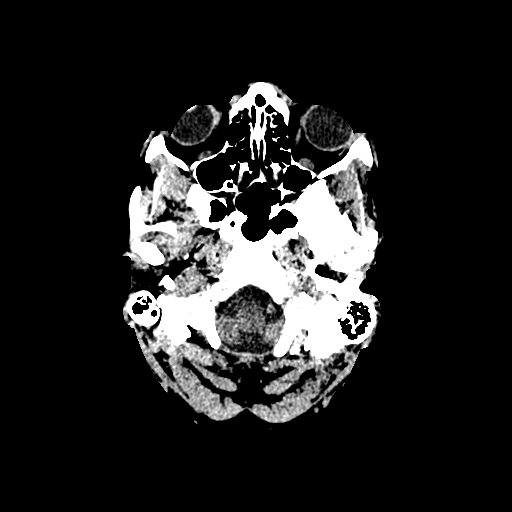
[im 20/256  bone]
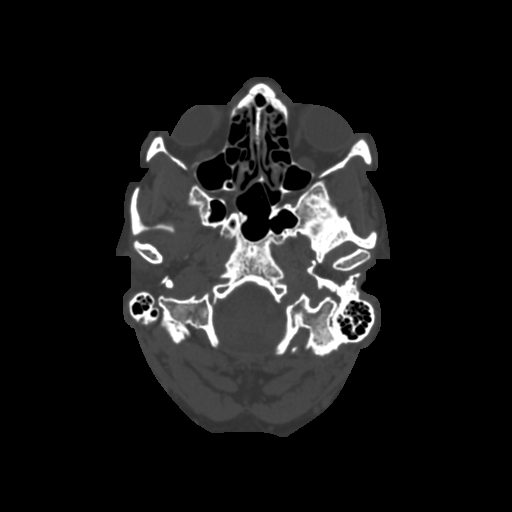
[im 40/256  brain]
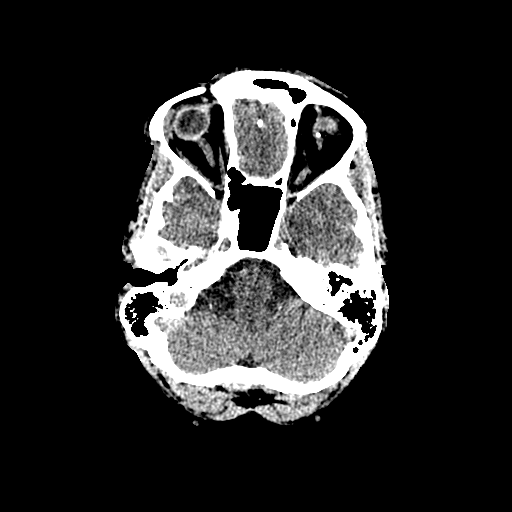
[im 59/256  brain]
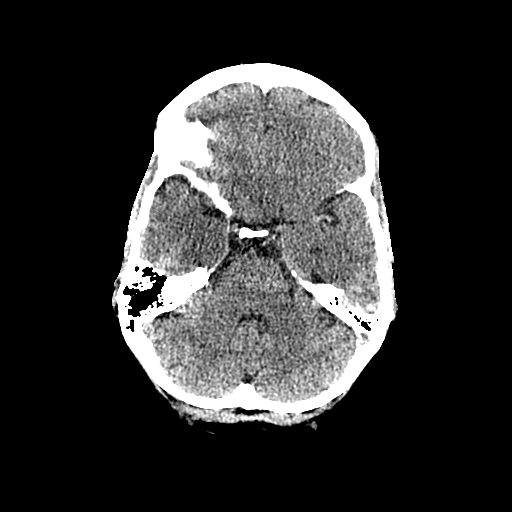
[im 79/256  brain]
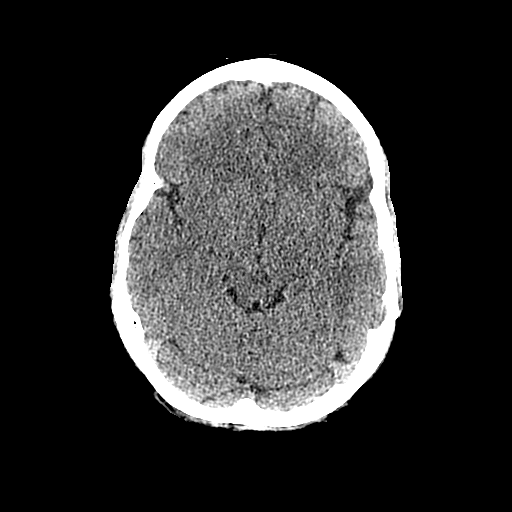
[im 99/256  brain]
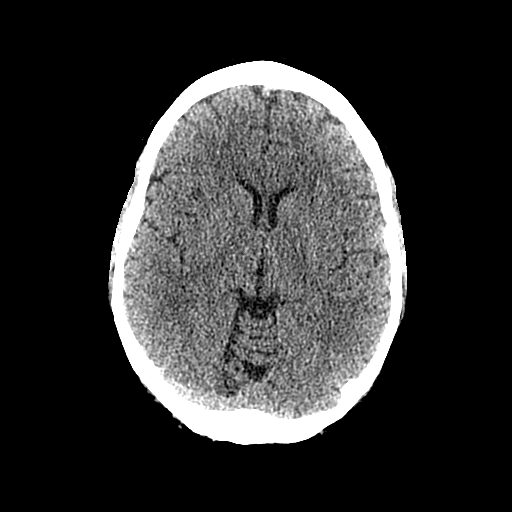
[im 99/256  bone]
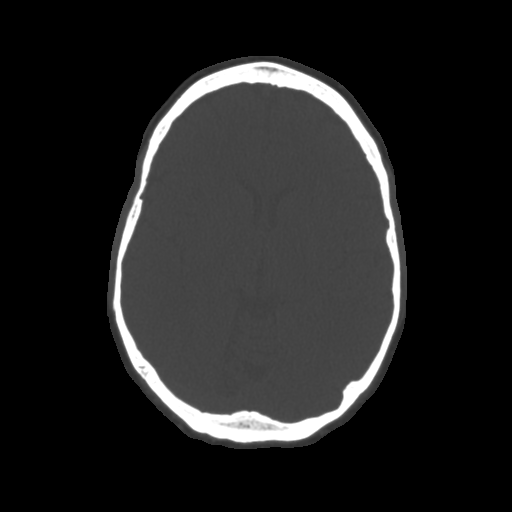
[im 118/256  brain]
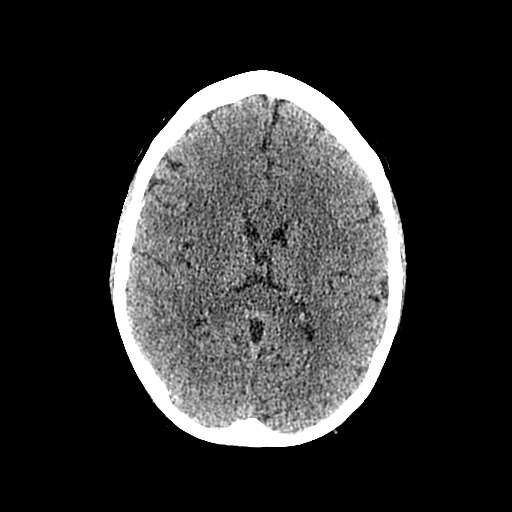
[im 138/256  brain]
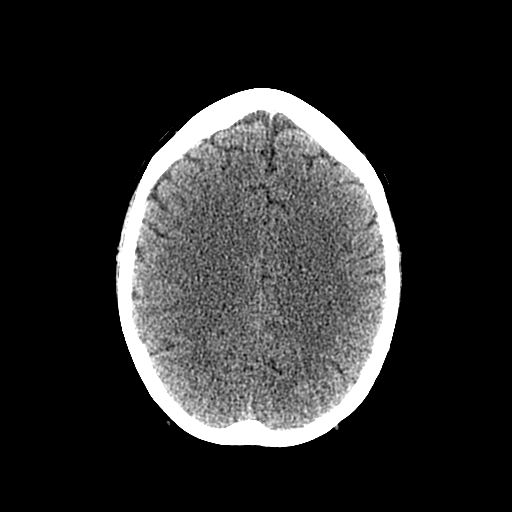
[im 157/256  brain]
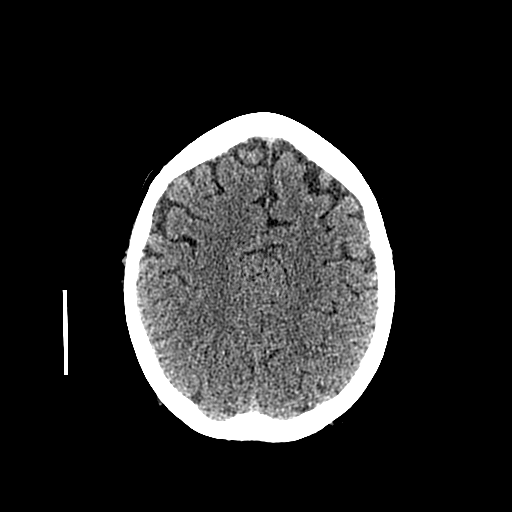
[im 177/256  brain]
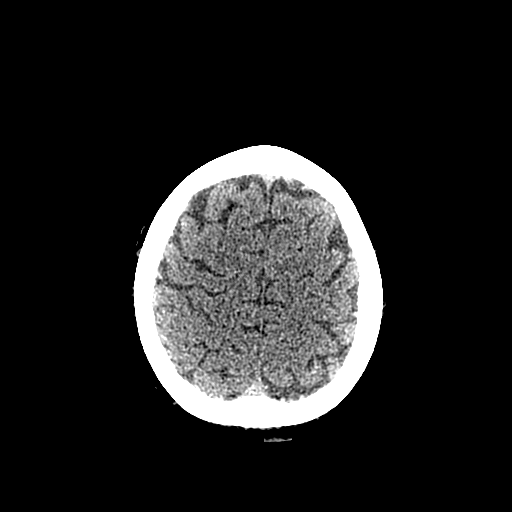
[im 177/256  bone]
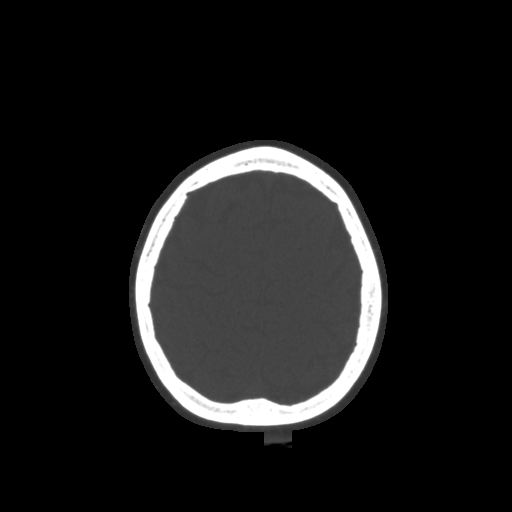
[im 197/256  brain]
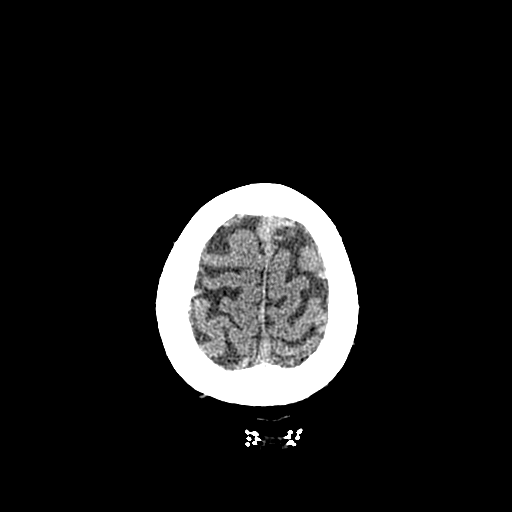
[im 216/256  brain]
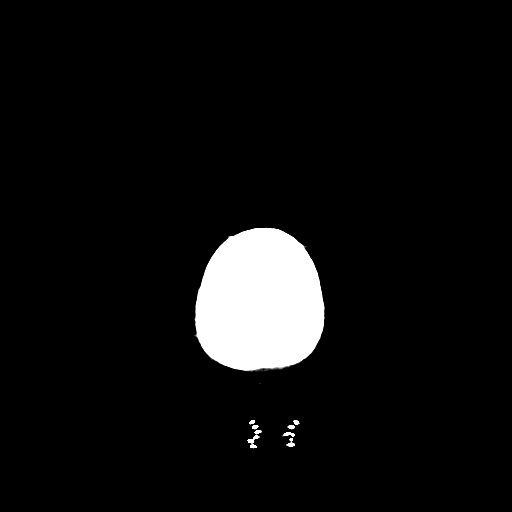
[im 236/256  brain]
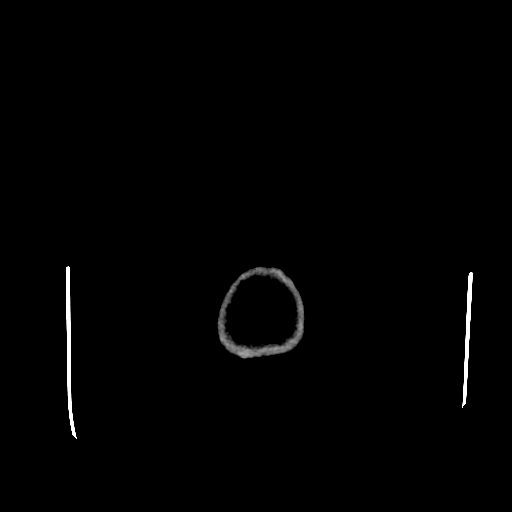

[Series 601: cor head · coronal · 0.49mm/px · 3 of 102 slices shown]
[im 26/102  brain]
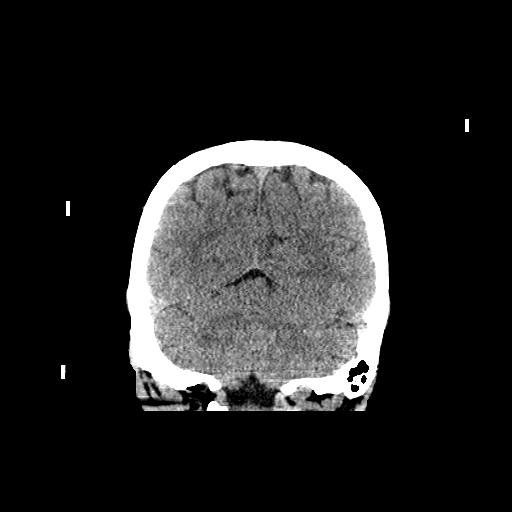
[im 51/102  brain]
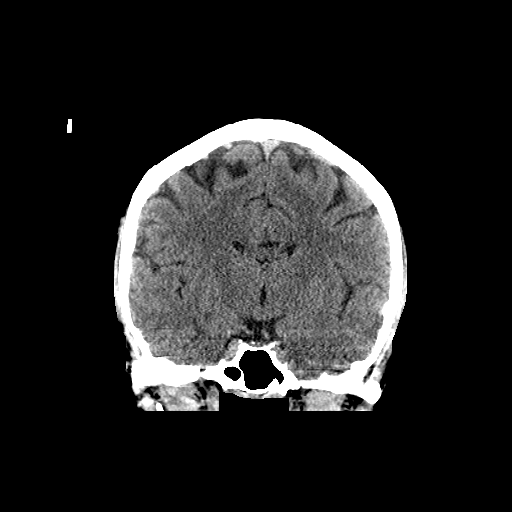
[im 76/102  brain]
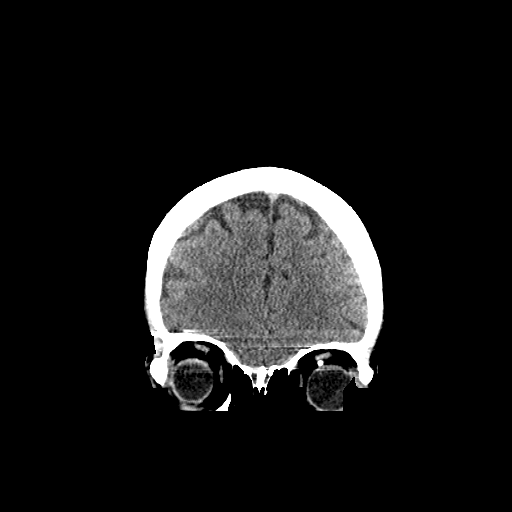

[15 of 40 positions shown; findings below may reference images not displayed]

FINDINGS: The brain parenchyma is unremarkable without territorial infarct or mass lesion. There is no intra-or extra-axial blood. The ventricles are normal in size and shape. Soft tissue evaluation demonstrates no acute process. Bone window evaluation is within normal limits.
IMPRESSION: 
IMPRESSION: :
No acute intracranial process..
All CT scans at this facility use interactive reconstruction technique, dose modulation, and/or weight based dose seen within appropriate to reduce radiation dose to as low as reasonably achievable.
Is the patient pregnant?
Unknown

## 2023-05-07 IMAGING — DX XR CHEST 1 VIEW
1 series · 1 of 1 positions shown · non-contrast
Comparison: None available.

FINAL REPORT:
INDICATION: cva

[AP]
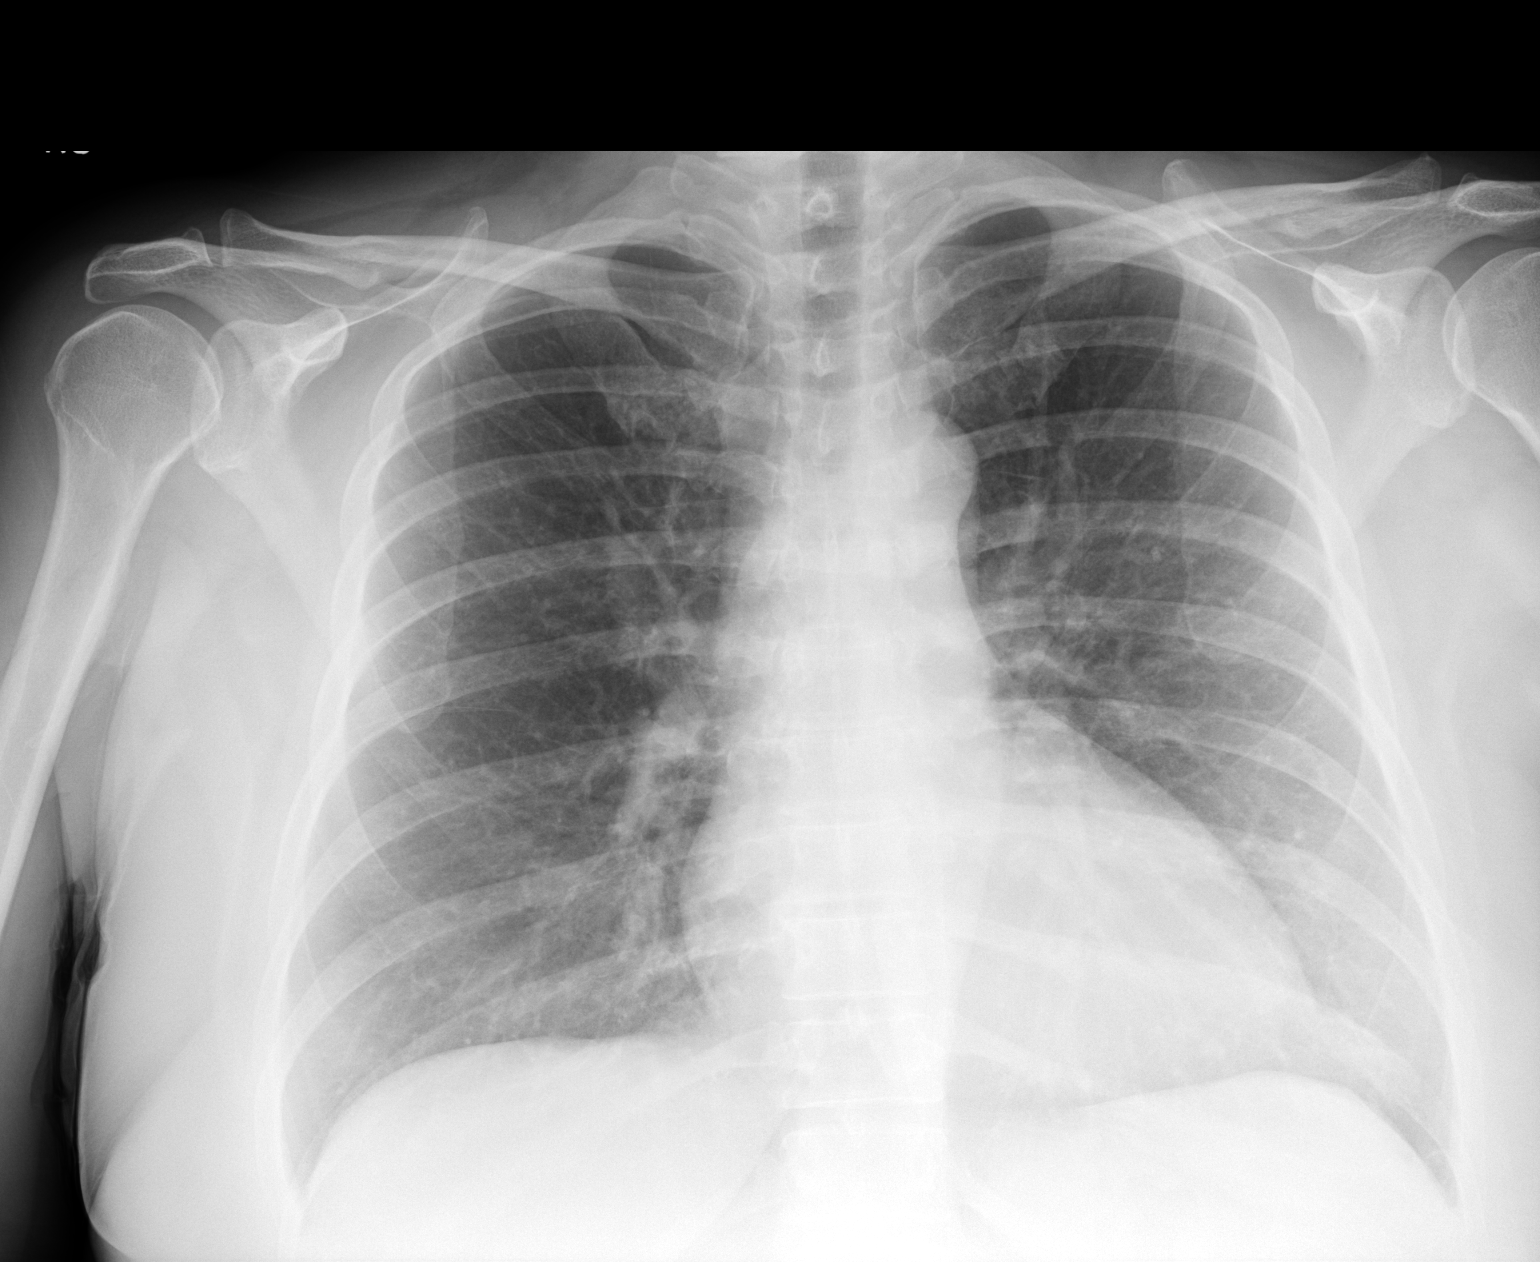

[1 of 1 positions shown; findings below may reference images not displayed]

FINDINGS: 1 view of the chest. The cardiomediastinal contours are within normal limits. No evidence of focal consolidation, pleural effusion, pulmonary edema, or pneumothorax.
IMPRESSION: 
IMPRESSION: No acute cardiopulmonary findings.
Is the patient pregnant?
Unknown

## 2023-05-08 IMAGING — MR MRA HEAD (BRAIN) ANGIO WITHOUT IV CONTRAST
5 series · 42 of 48 positions shown · non-contrast
Comparison: none

FINAL REPORT:
MRA of the head without contrast
HISTORY: Headache, new or worsening (Age >= 50y)
TECHNIQUE: 3-D time-of-flight MRA images were obtained through the intracranial circulation. Reconstructed MIP images were obtained. Stenosis measurements obtained utilizing NASCET criteria.

[Series 1001: survey · sagittal · 1.6mm · 1.62mm/px · 19 of 128 slices shown]
[im 1/128]
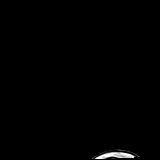
[im 8/128]
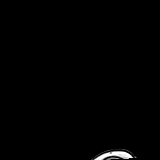
[im 15/128]
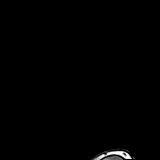
[im 22/128]
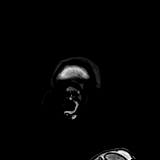
[im 29/128]
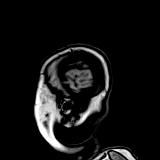
[im 36/128]
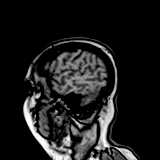
[im 43/128]
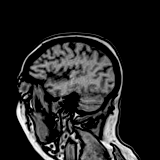
[im 50/128]
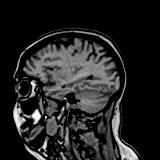
[im 57/128]
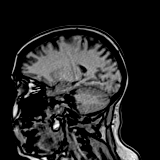
[im 64/128]
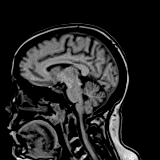
[im 71/128]
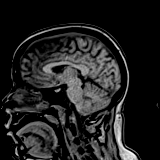
[im 78/128]
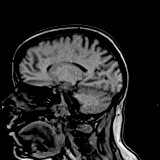
[im 85/128]
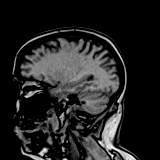
[im 92/128]
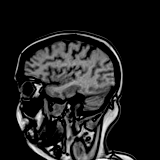
[im 99/128]
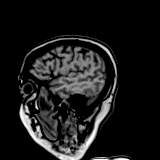
[im 106/128]
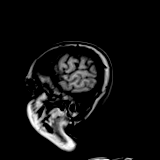
[im 113/128]
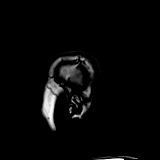
[im 120/128]
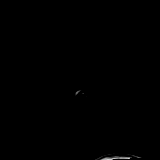
[im 128/128]
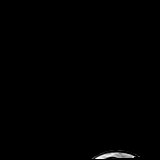

[Series 2001: survey_mpr_sag · oblique · 1.6mm · 1.60mm/px · 1 of 5 slices shown]
[im 1/5]
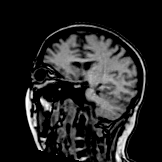

[Series 3001: survey_mpr_cor · coronal · 1.6mm · 1.60mm/px · 1 of 3 slices shown]
[im 1/3]
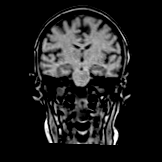

[Series 4001: survey_mpr_tra · axial · 1.6mm · 1.60mm/px · 1 of 3 slices shown]
[im 1/3]
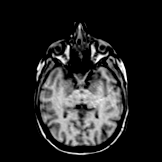

[Series 6001: MRA · axial · 0.5mm · 0.39mm/px · z∈[-67,+13]mm · 20 of 168 slices shown]
[im 1/168]
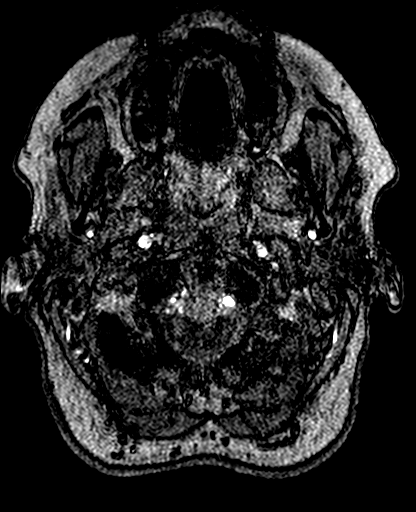
[im 7/168]
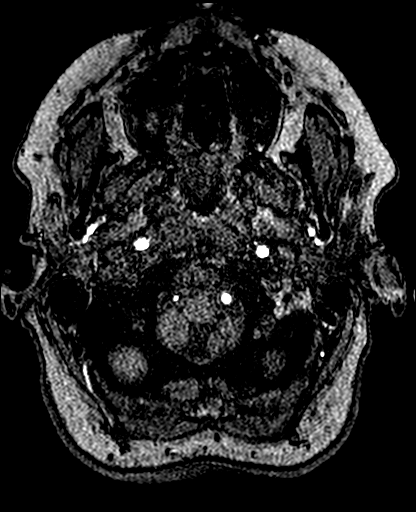
[im 14/168]
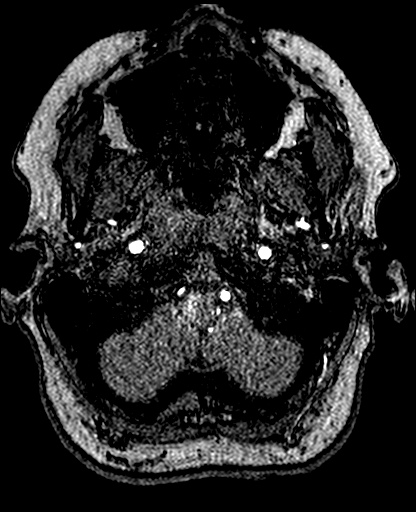
[im 21/168]
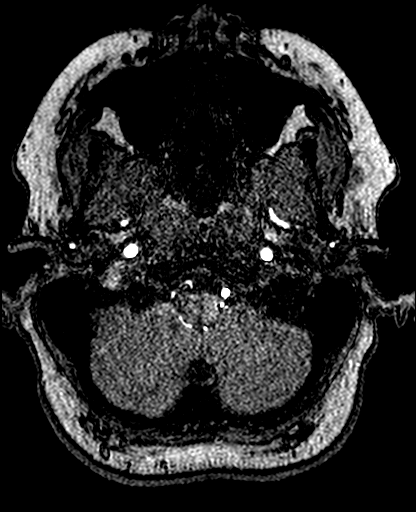
[im 27/168]
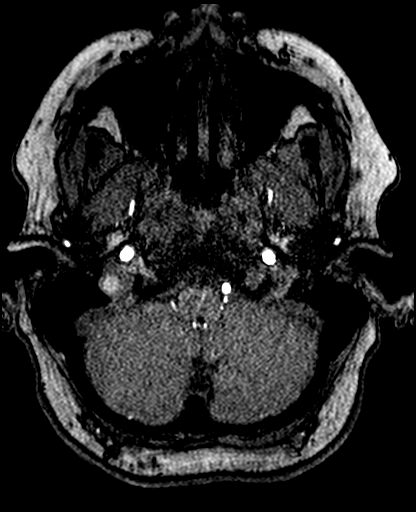
[im 34/168]
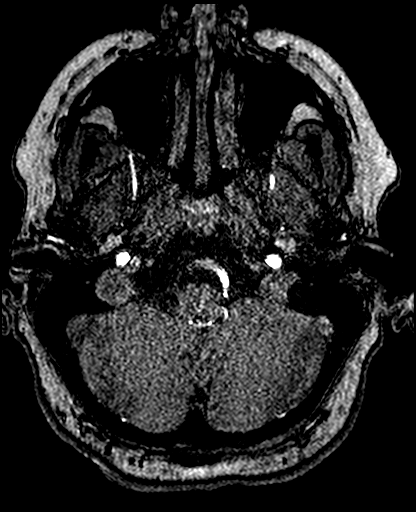
[im 41/168]
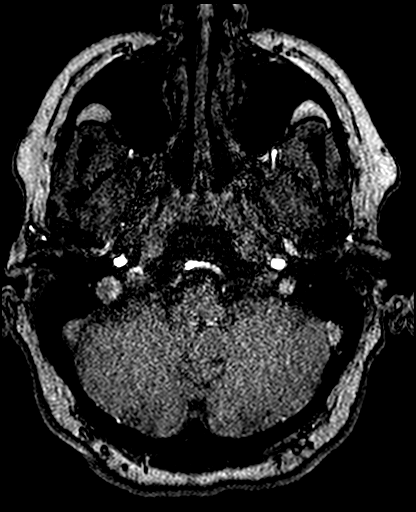
[im 47/168]
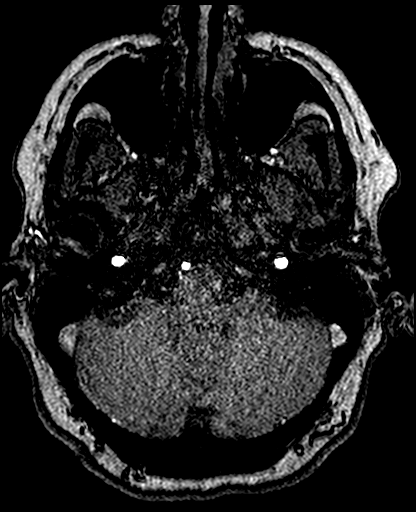
[im 54/168]
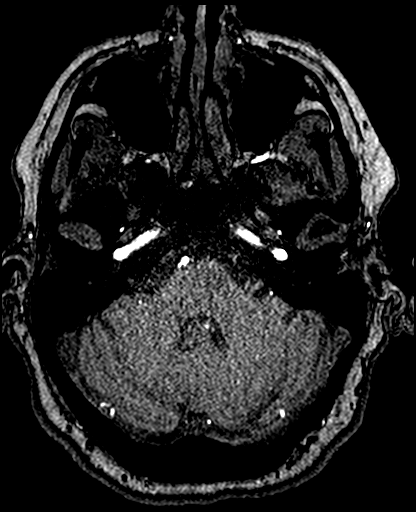
[im 61/168]
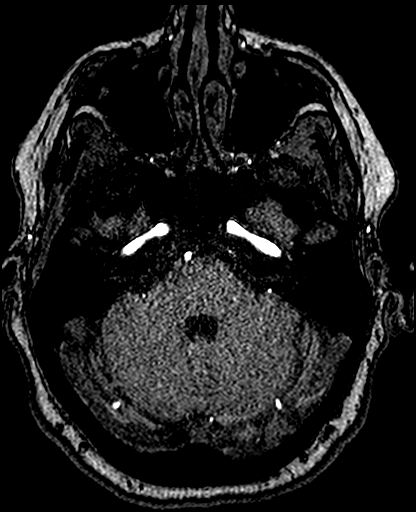
[im 67/168]
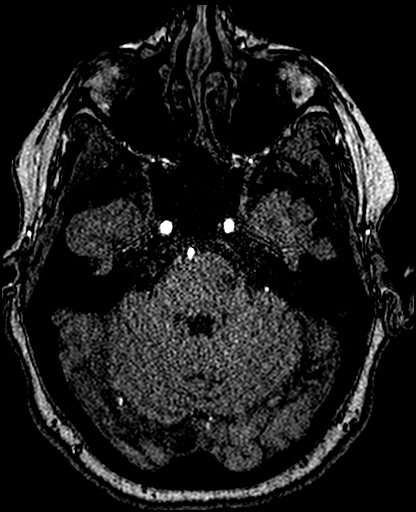
[im 74/168]
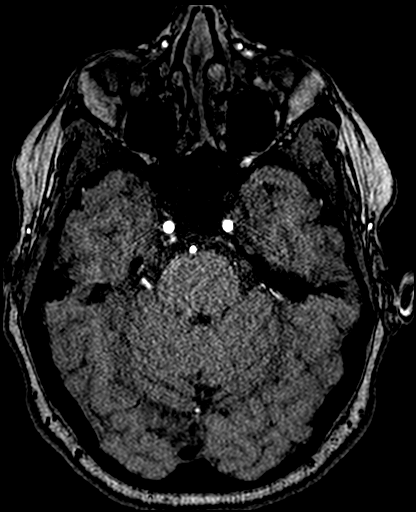
[im 81/168]
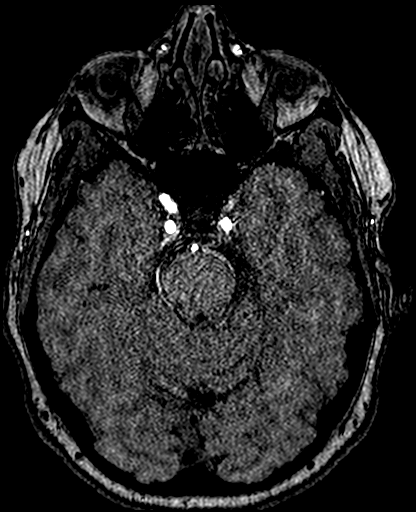
[im 87/168]
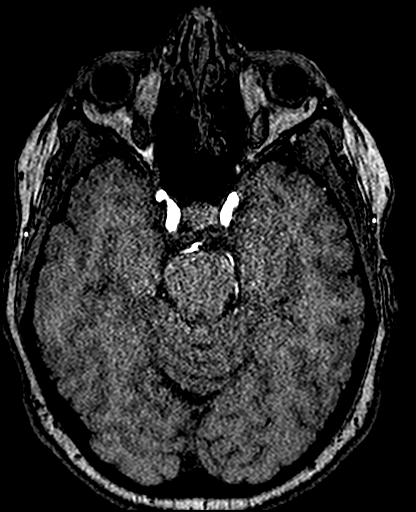
[im 94/168]
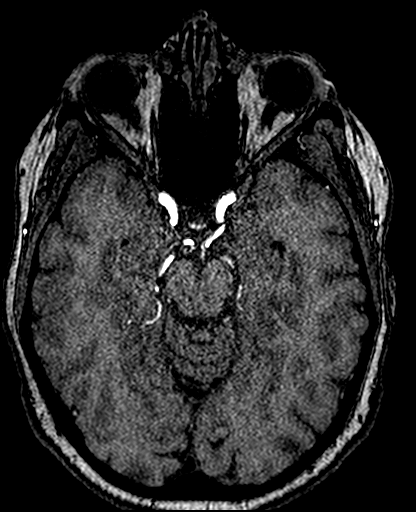
[im 101/168]
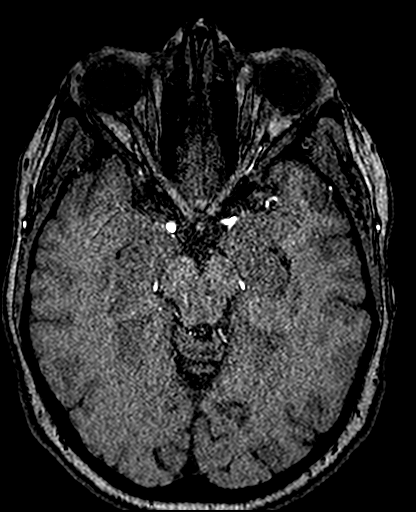
[im 107/168]
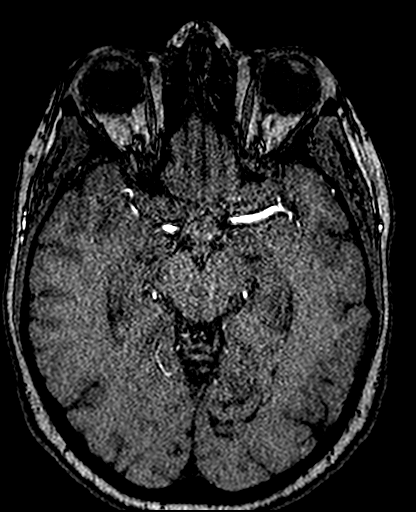
[im 114/168]
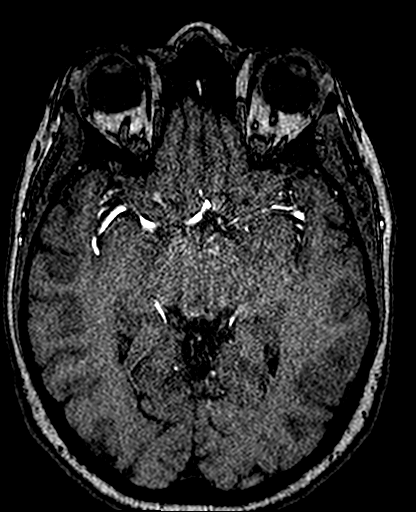
[im 141/168]
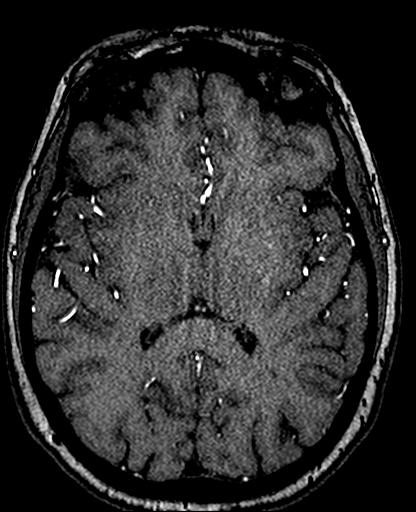
[im 161/168]
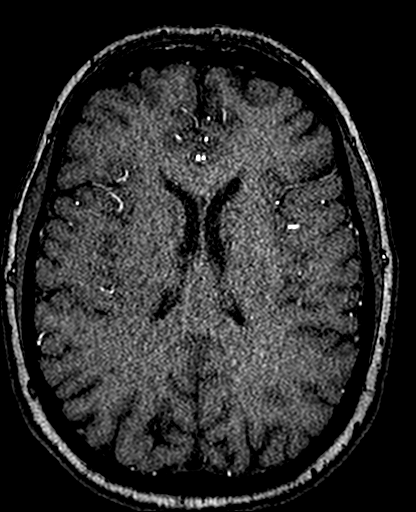

[42 of 48 positions shown; findings below may reference images not displayed]

FINDINGS: The intracranial segments of the internal carotid arteries are widely patent with no hemodynamically significant stenosis or aneurysm. The anterior, middle, and posterior cerebral arteries and their major branches are patent with no hemodynamically significant stenosis or aneurysm. There is a fetal-type origin to the left PCA.
Basilar artery is widely patent with no hemodynamically significant stenosis or aneurysm.  The intracranial segments of the vertebral arteries are unremarkable. Right vertebral artery terminates as the right PICA.
IMPRESSION: 
IMPRESSION: No hemodynamically significant stenosis or aneurysm within the imaged intracranial circulation.
Is the patient pregnant?
Unknown

## 2023-05-08 IMAGING — MR MRI BRAIN WITHOUT CONTRAST
13 series · 48 of 48 positions shown · IV contrast (gadolinium)
Comparison: none

FINAL REPORT:
MRI of the brain without contrast
HISTORY: Headache, new or worsening, neuro deficit (Age 19-49y)
TECHNIQUE: Multiplanar and multisequence images were acquired through the brain without IV gadolinium contrast.

[Series 1001: survey · sagittal · 1.6mm · 1.62mm/px · 13 of 128 slices shown]
[im 1/128]
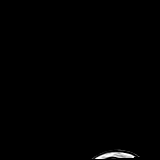
[im 11/128]
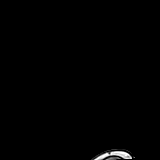
[im 22/128]
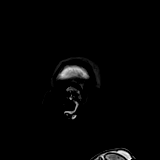
[im 32/128]
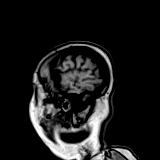
[im 43/128]
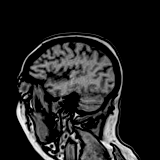
[im 53/128]
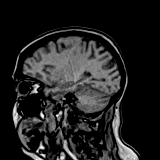
[im 64/128]
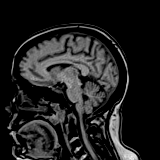
[im 75/128]
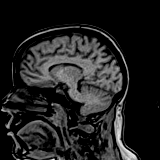
[im 85/128]
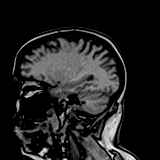
[im 96/128]
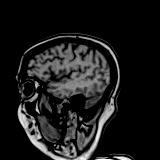
[im 106/128]
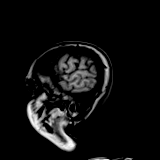
[im 117/128]
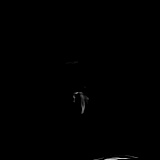
[im 128/128]
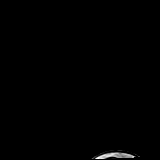

[Series 2001: survey_mpr_sag · oblique · 1.6mm · 1.60mm/px · 1 of 5 slices shown]
[im 1/5]
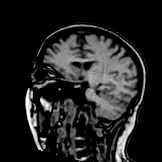

[Series 3001: survey_mpr_cor · coronal · 1.6mm · 1.60mm/px · 1 of 3 slices shown]
[im 1/3]
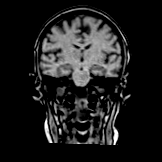

[Series 4001: survey_mpr_tra · axial · 1.6mm · 1.60mm/px · 1 of 3 slices shown]
[im 1/3]
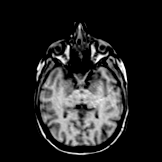

[Series 5001: T1 · sagittal · 5.0mm · 0.72mm/px · 2 of 24 slices shown (1 of 2)]
[im 1/24]
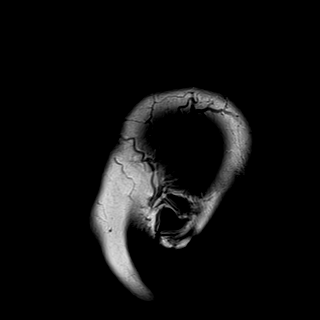
[im 24/24]
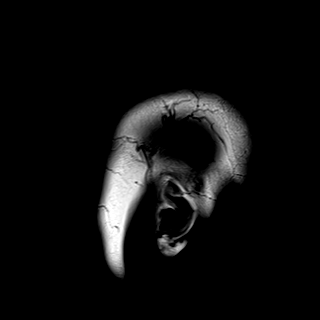

[Series 6002: ax dwi_tracew · axial · 4.0mm · 0.60mm/px · z∈[-84,+74]mm · 3 of 34 slices shown]
[im 1/34]
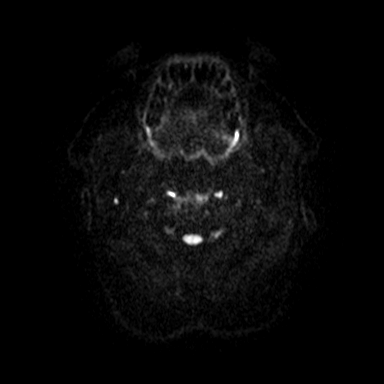
[im 17/34]
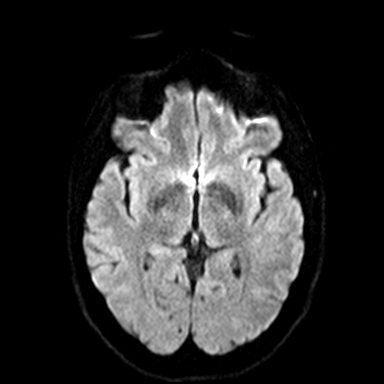
[im 34/34]
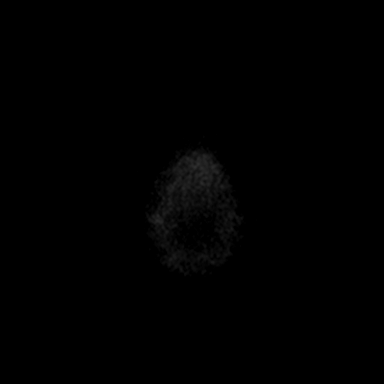

[Series 7001: ax dwi_adc · axial · 4.0mm · 0.60mm/px · z∈[-84,+74]mm · 3 of 34 slices shown]
[im 1/34]
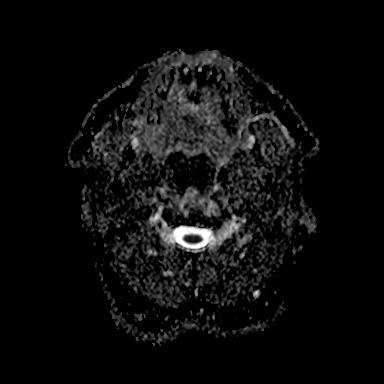
[im 17/34]
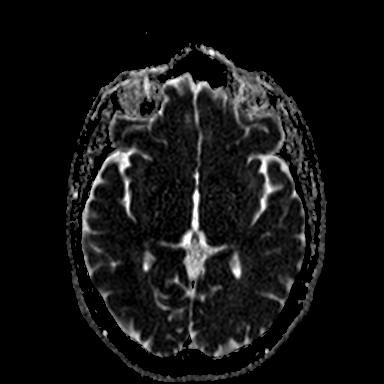
[im 34/34]
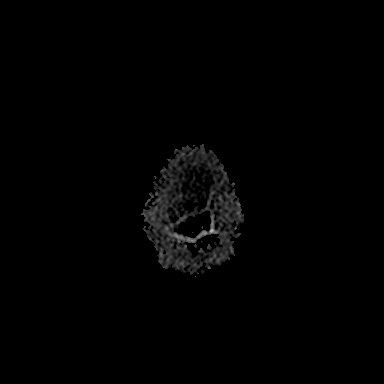

[Series 8001: ax dwi_exp · axial · 4.0mm · 0.60mm/px · z∈[-84,+74]mm · 4 of 34 slices shown]
[im 1/34]
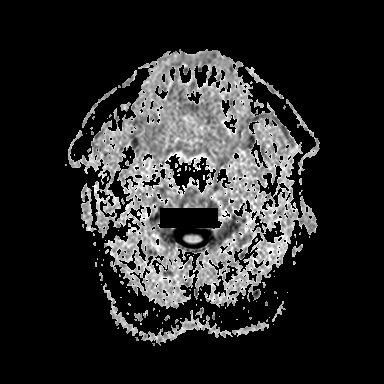
[im 12/34]
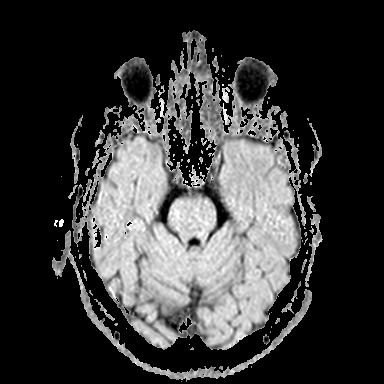
[im 23/34]
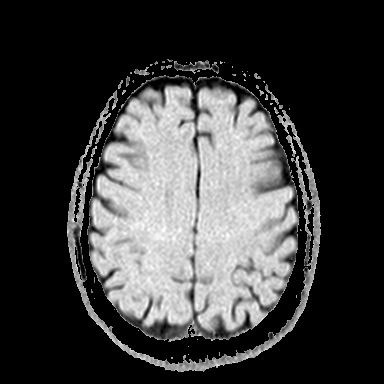
[im 34/34]
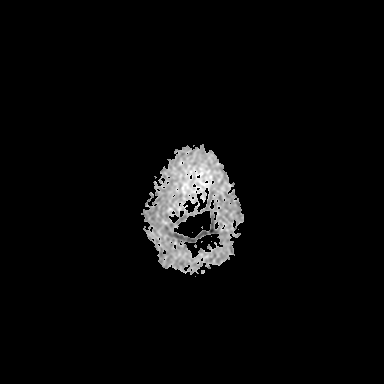

[Series 9001: T2 · axial · 4.0mm · 0.45mm/px · z∈[-84,+74]mm · 4 of 34 slices shown (1 of 2)]
[im 1/34]
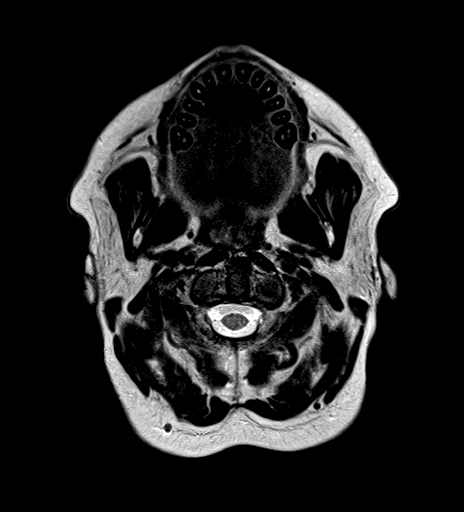
[im 12/34]
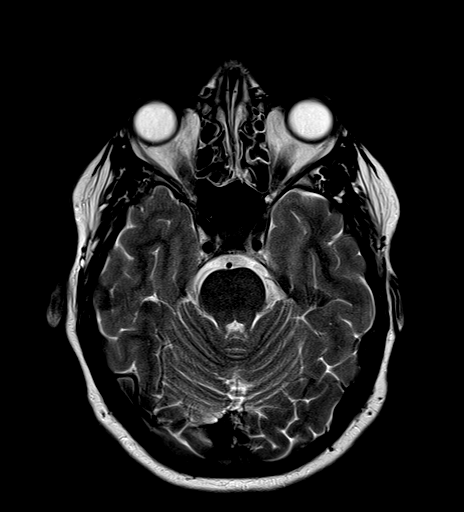
[im 23/34]
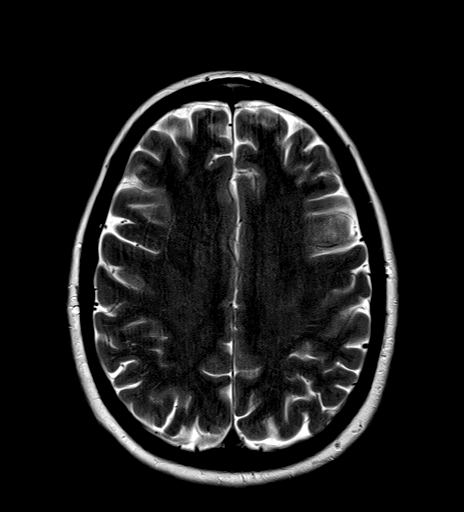
[im 34/34]
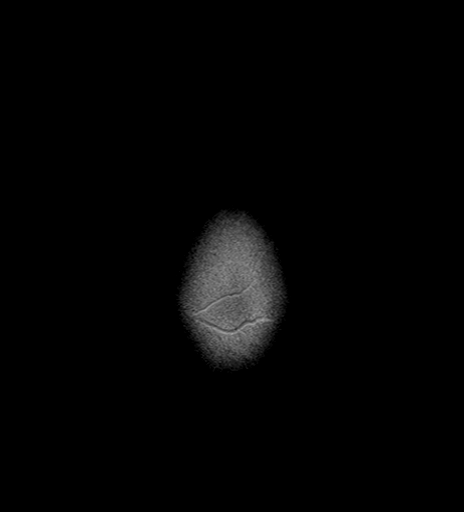

[FLAIR · axial · 4.0mm · 0.72mm/px · z∈[-83,+74]mm · 4 of 34 slices shown]
[im 1/34]
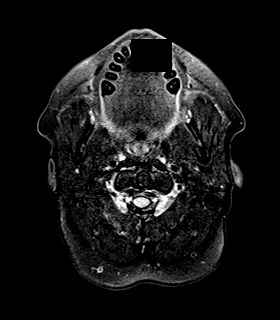
[im 12/34]
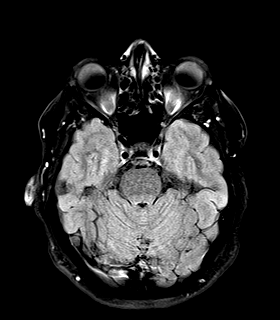
[im 23/34]
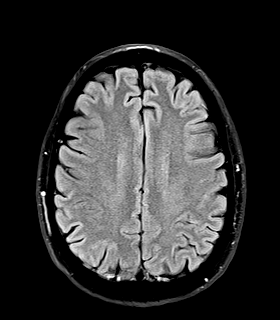
[im 34/34]
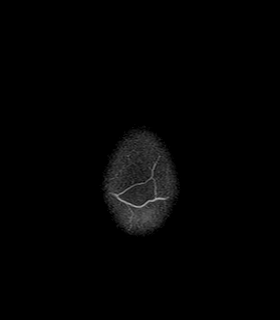

[T1 · axial · 4.0mm · 0.72mm/px · z∈[-84,+74]mm · 4 of 34 slices shown (2 of 2)]
[im 1/34]
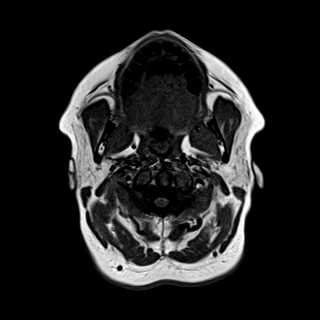
[im 12/34]
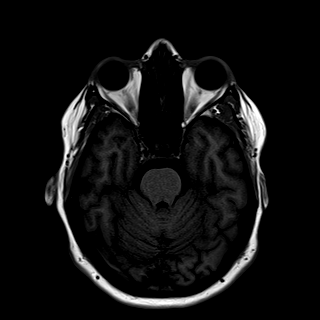
[im 23/34]
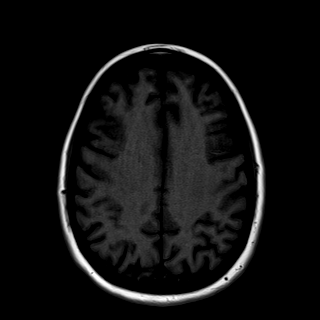
[im 34/34]
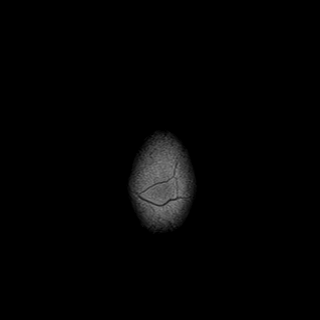

[GRE · axial · 4.0mm · 0.45mm/px · z∈[-83,+74]mm · 4 of 34 slices shown]
[im 1/34]
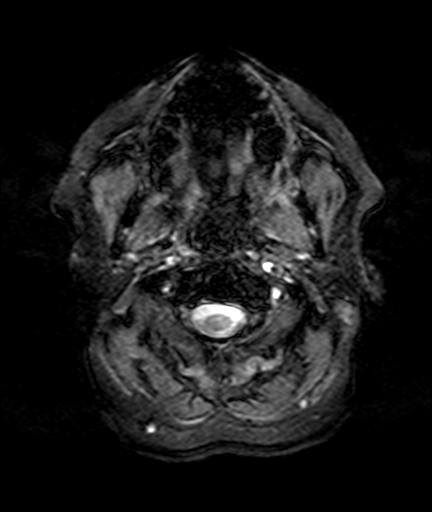
[im 12/34]
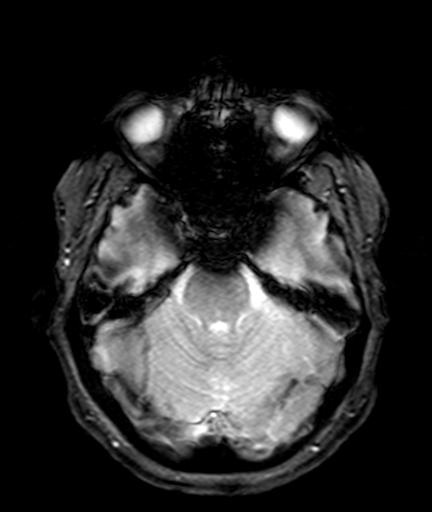
[im 23/34]
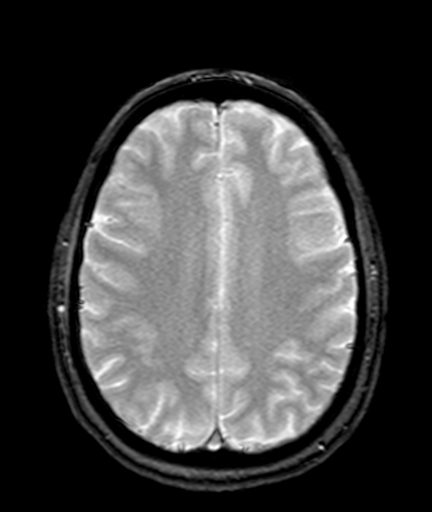
[im 34/34]
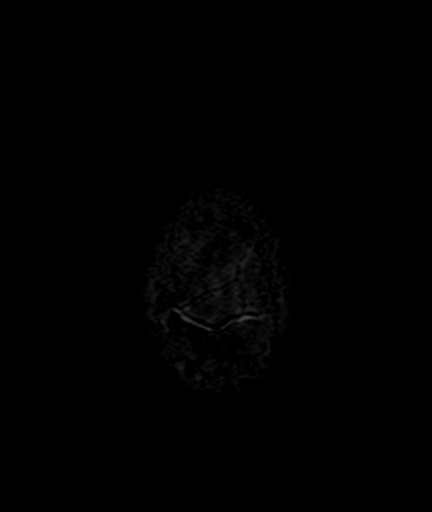

[T2 · axial · 4.0mm · 0.90mm/px · z∈[-83,+74]mm · 4 of 34 slices shown (2 of 2)]
[im 1/34]
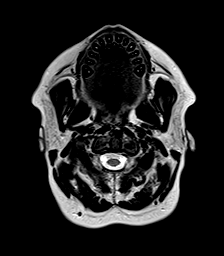
[im 12/34]
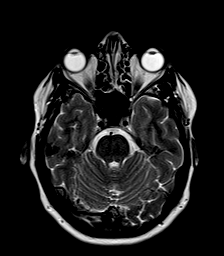
[im 23/34]
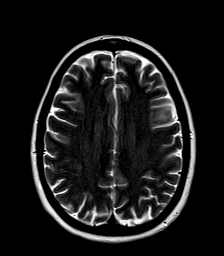
[im 34/34]
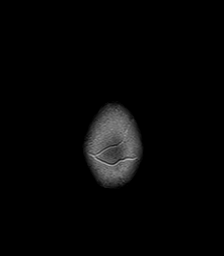

[48 of 48 positions shown; findings below may reference images not displayed]

FINDINGS: No abnormal areas of restricted diffusion. No abnormal areas of susceptibility artifact on gradient echo images. The cerebral parenchyma is unremarkable.
The ventricles are within normal size limits. The posterior fossa contents are unremarkable. Sella is unremarkable. The imaged paranasal sinuses and mastoid air cells are predominantly clear. There are grossly normal flow voids in the major intracranial vessels.
IMPRESSION: 
IMPRESSION: Unremarkable MRI of the brain. No acute intracranial process.
Is the patient pregnant?
Unknown

## 2023-05-10 IMAGING — RF FL LUMBAR PUNCTURE
1 series · 1 of 1 positions shown · non-contrast
Comparison: none

Air kerma P.OPm1y, Time 0.17, Images 1, Dr Habeebullahi
FINAL REPORT:
Brief operative note:
Operator: Mickens, Mechelle
Gibbons:
Pre and postop diagnosis: possible pseudotumor cerebri
Procedure: FL LUMBAR PUNCTURE
Anesthesia: Local using 1% lidocaine , moderate conscious sedation using graded doses of fentanyl and Versed
Specimen: None
Estimated blood loss: Minimal
Blood administered: None
Complications: None
Implants: None
Full operative report:
PROCEDURE: Fluoro-guided lumbar puncture.
HISTORY: Fluoroscopic guidance is requested.
COMPLICATIONS: None.
Informed consent was obtained after discussing procedure, risks, benefits and alternatives with the patient and family.
Patient is placed in the prone oblique position on the fluoroscopy table. The L4 level is targeted. Betadine is applied to the skin. 1% Xylocaine was infiltrated in the skin and the tissues. 22 gauge spinal needle was advanced under fluoroscopic guidance with paraspinal approach. Eventually clear cerebrospinal fluid drains. Opening pressure was not obtained. A total of 12 mL of  CSF is obtained in 4 aliquots. The needle was removed and the patient was turned to the flat supine position. The patient tolerated the procedure well.
Fluoroscopy time of 0.2 minutes. Single spot fluoroscopic image obtained. Air kerma of 2.9 mGy.

[Series 1: lumbarspine, digital · 0.08mm/px · 1 of 1 slices shown]
[im 1/1]
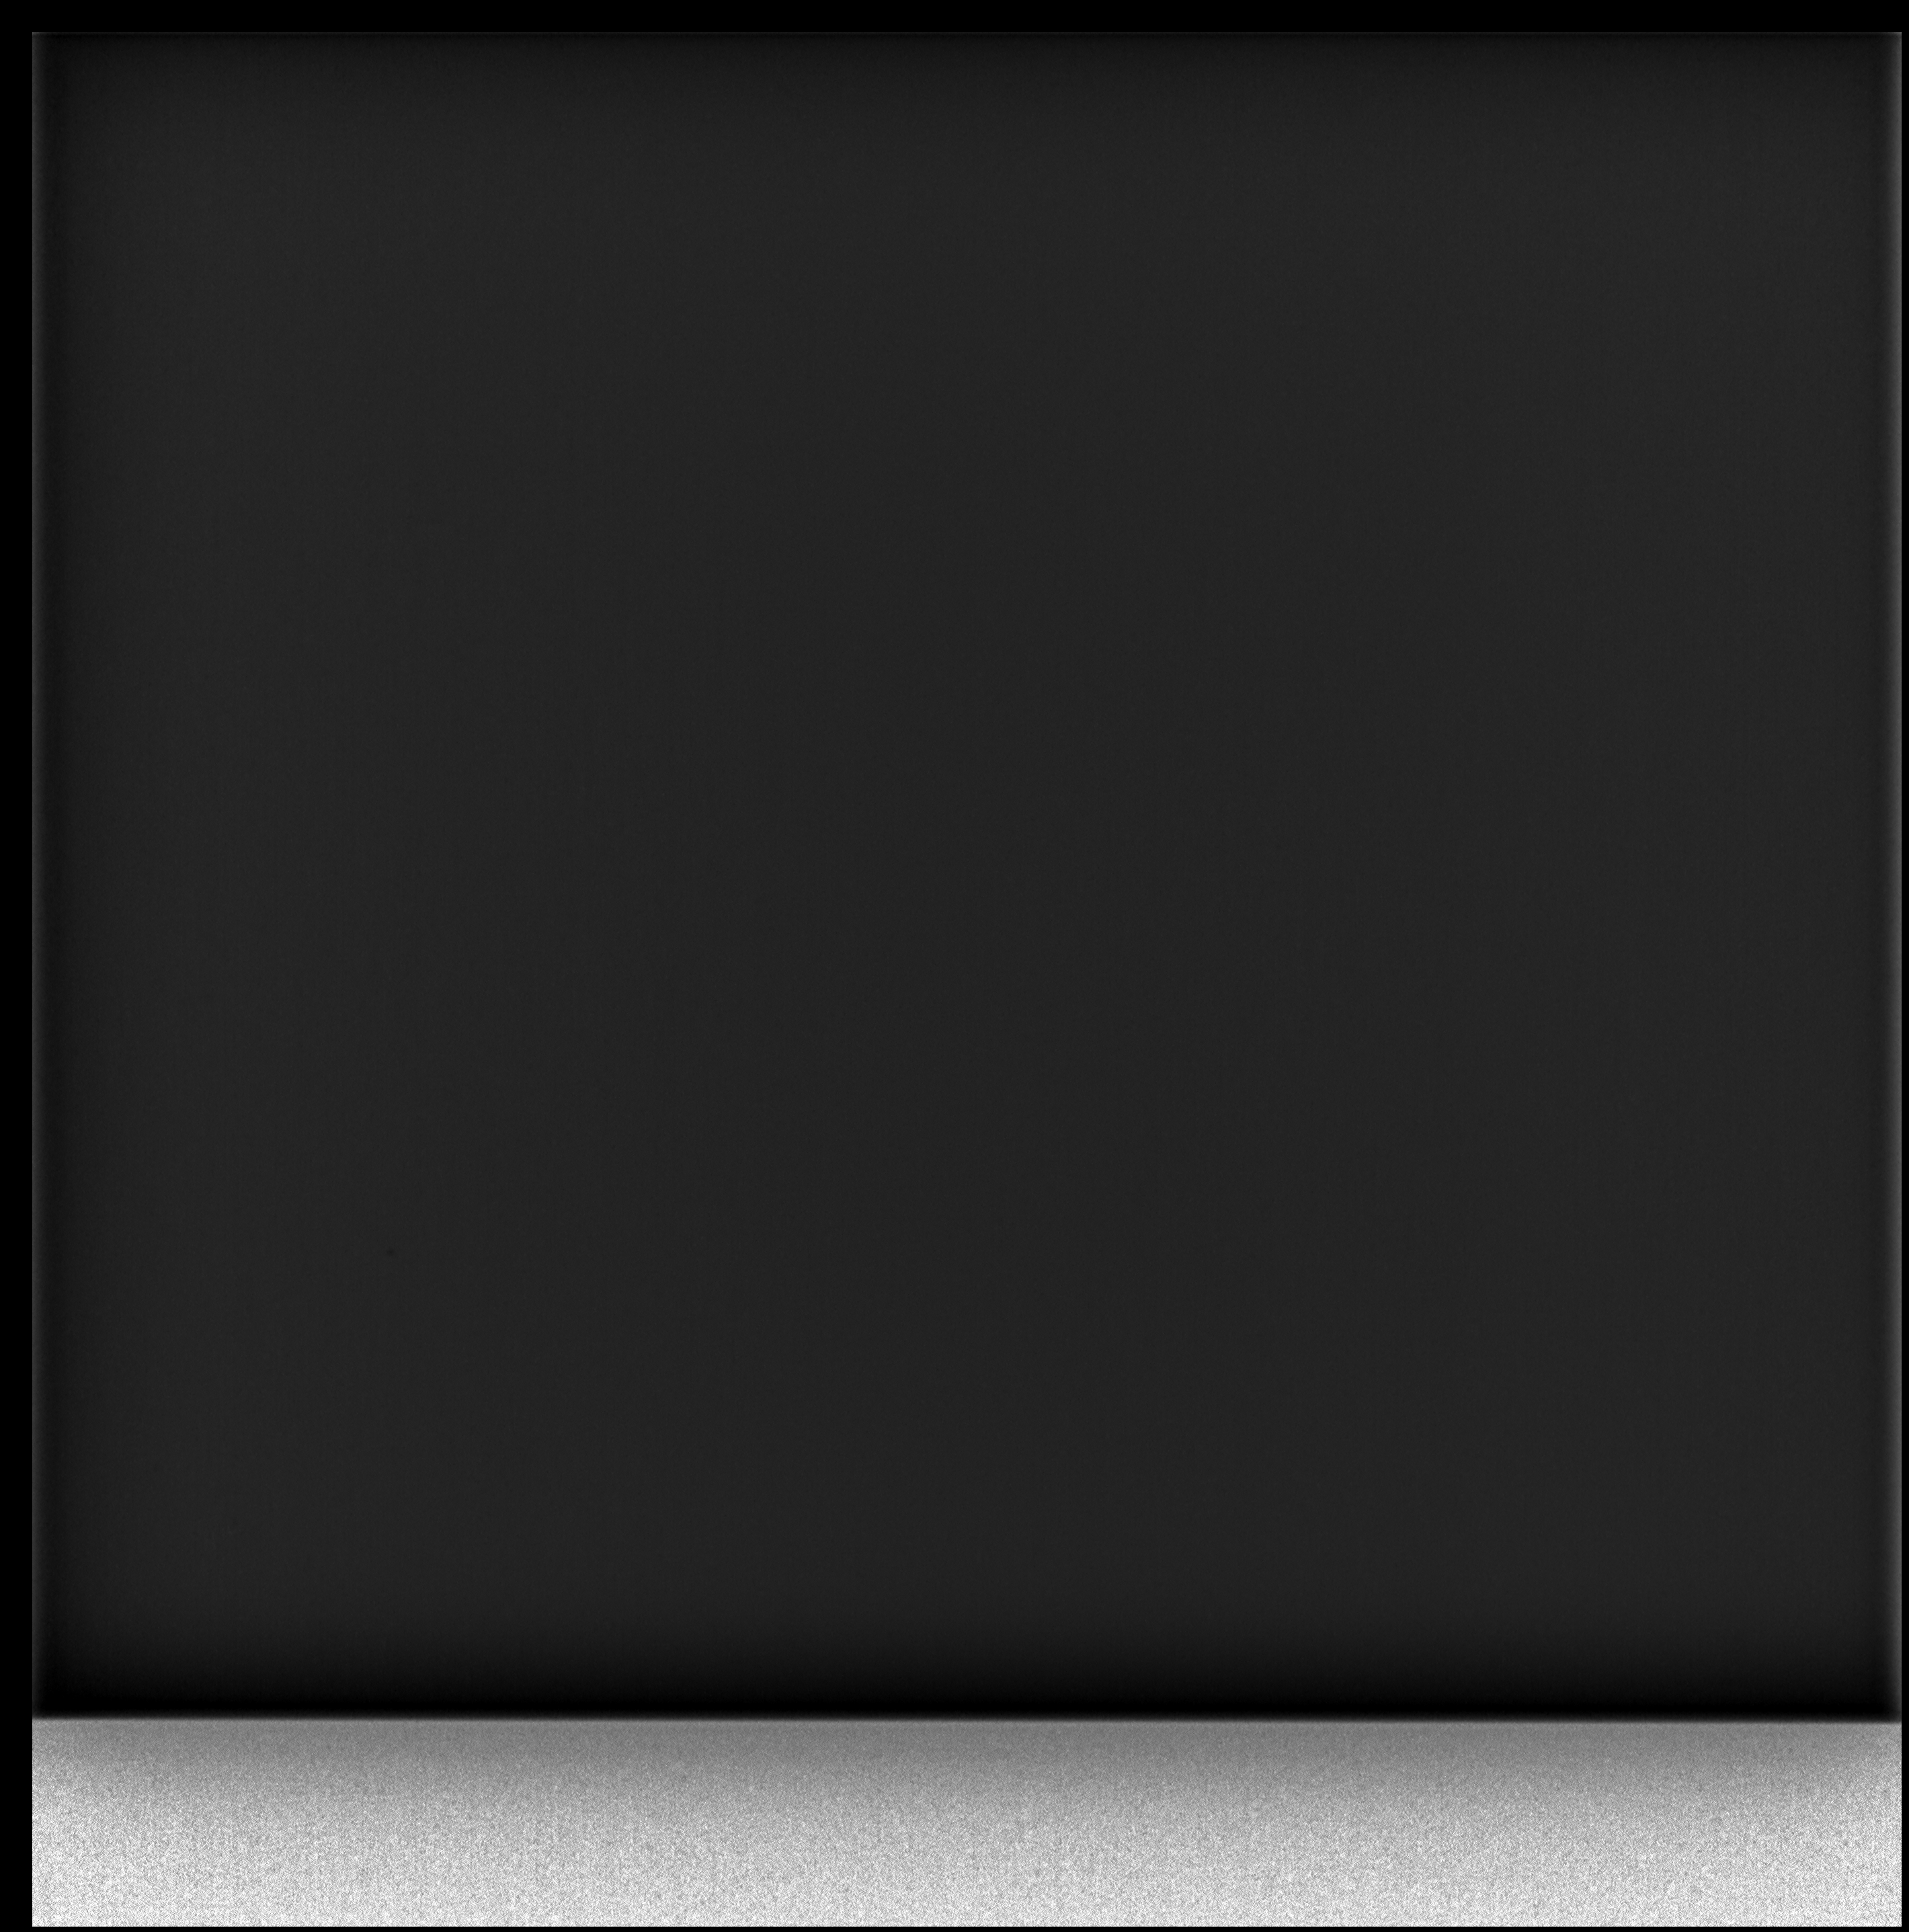

[1 of 1 positions shown; findings below may reference images not displayed]

IMPRESSION: Fluoro-guided lumbar puncture.
Is the patient pregnant?
No

## 2023-08-31 IMAGING — CT CT ABDOMEN PELVIS WITHOUT CONTRAST
2 of 4 series · 16 of 46 positions shown, 18 images · non-contrast
Comparison: 08/26/2010

Right flank pain
History of gallbladder removal, vaginal mesh, hysterectomy
No cancer history
FINAL REPORT:
CT ABDOMEN PELVIS WITHOUT CONTRAST
INDICATION: Right flank pain.
TECHNIQUE: CT of the abdomen and pelvis was performed without contrast. Coronal and sagittal reformats were submitted for evaluation.

[Series 3: thins · axial · 0.82mm/px · z∈[-427,-27]mm · 13 of 356 slices shown, 15 images]
[im 18/356  soft-tissue]
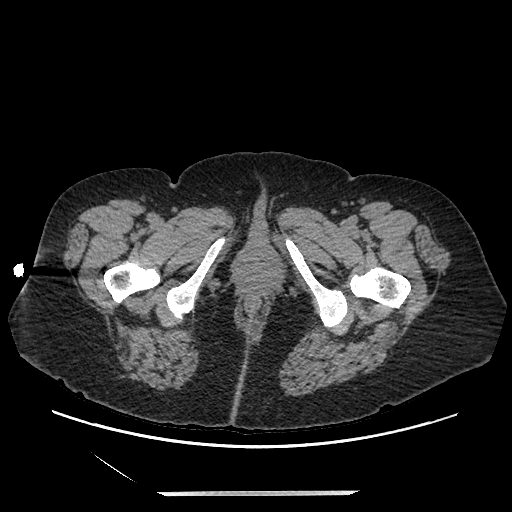
[im 18/356  bone]
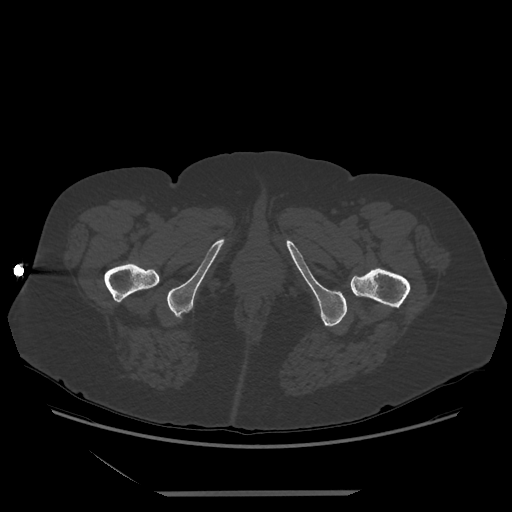
[im 54/356  soft-tissue]
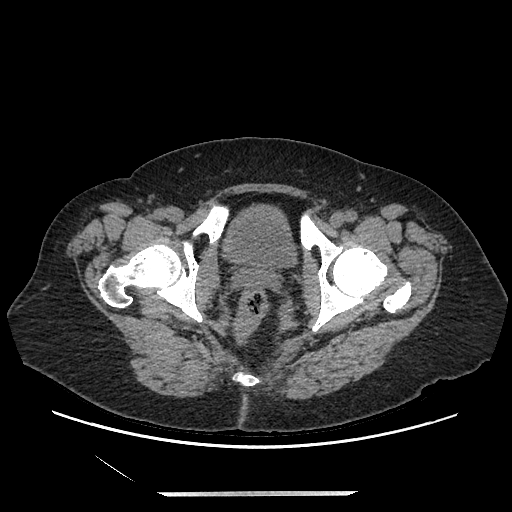
[im 72/356  soft-tissue]
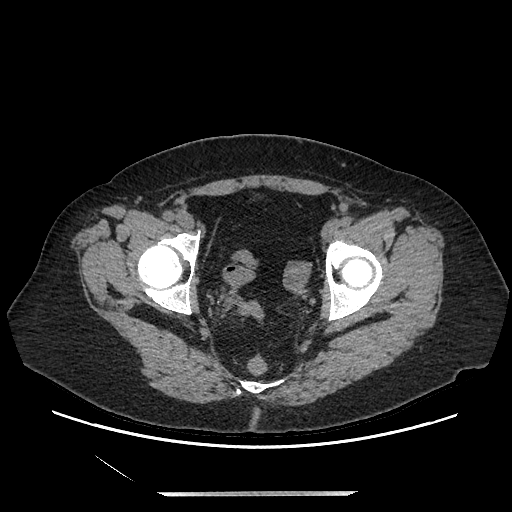
[im 107/356  soft-tissue]
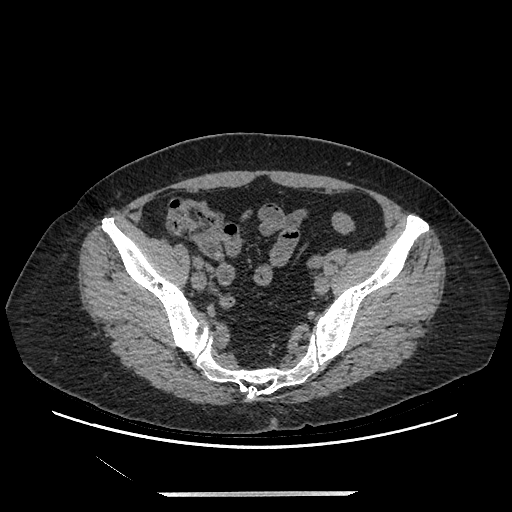
[im 125/356  soft-tissue]
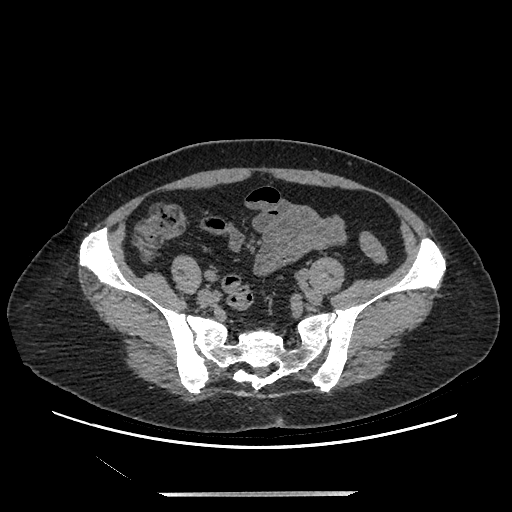
[im 160/356  soft-tissue]
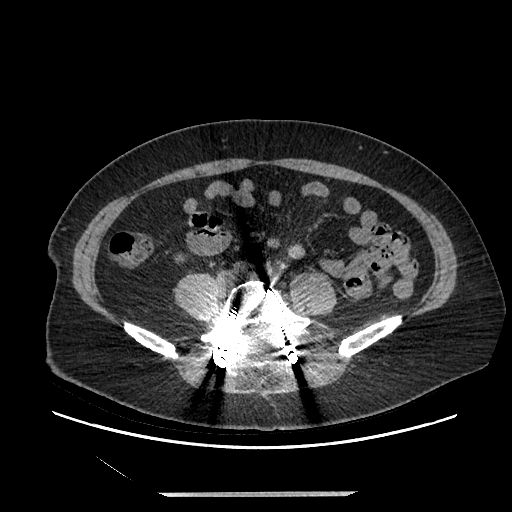
[im 178/356  soft-tissue]
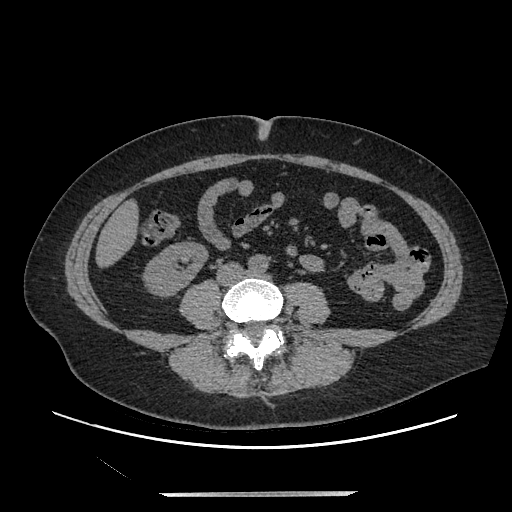
[im 196/356  soft-tissue]
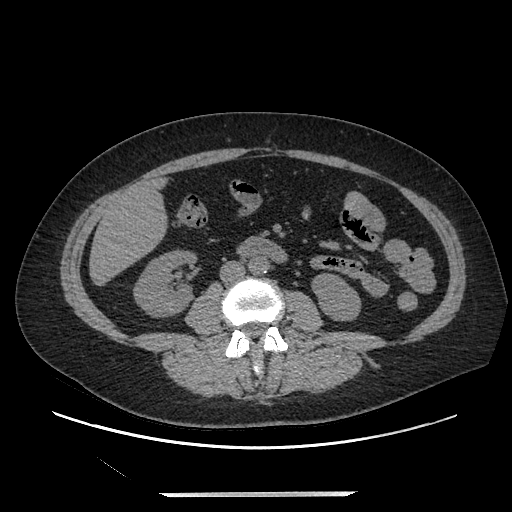
[im 231/356  soft-tissue]
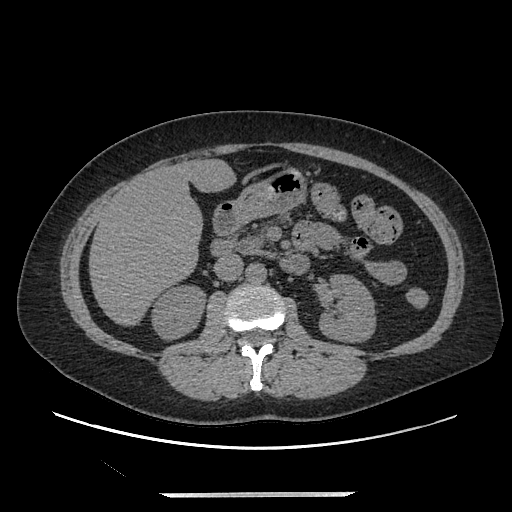
[im 231/356  bone]
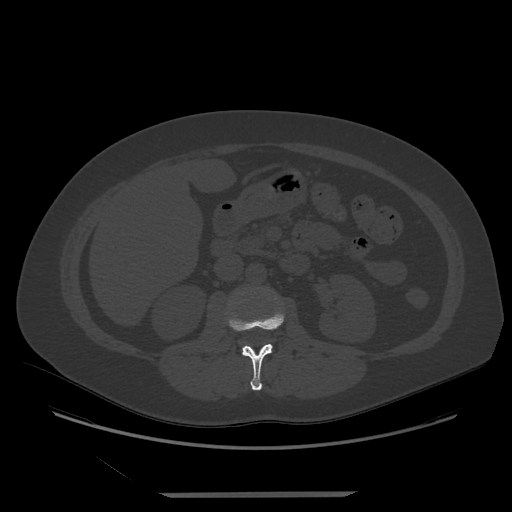
[im 249/356  soft-tissue]
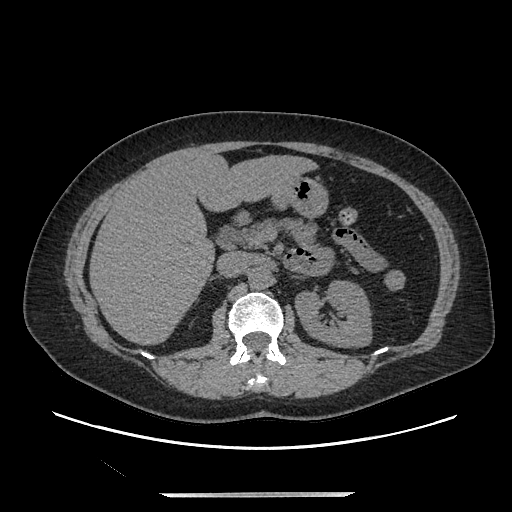
[im 285/356  soft-tissue]
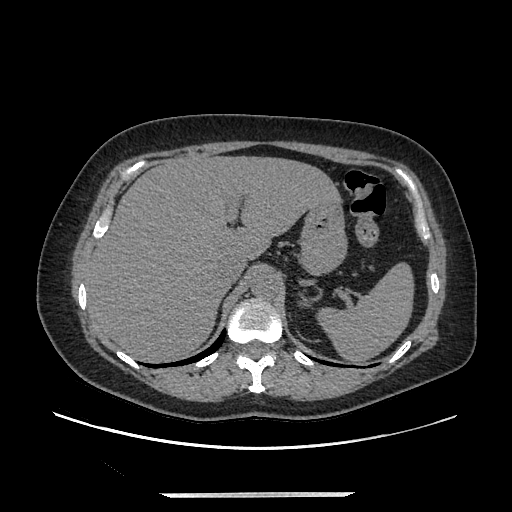
[im 302/356  soft-tissue]
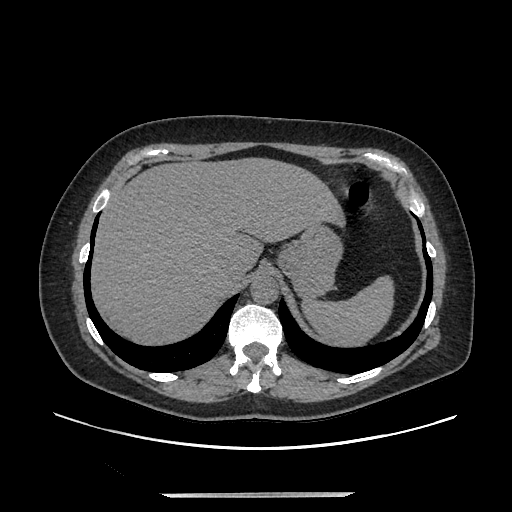
[im 338/356  soft-tissue]
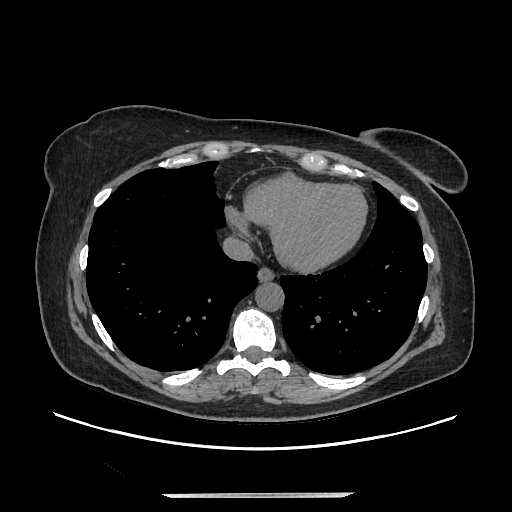

[Series 602: sag standard 2x2 · sagittal · 0.87mm/px · 3 of 208 slices shown]
[im 70/208  soft-tissue]
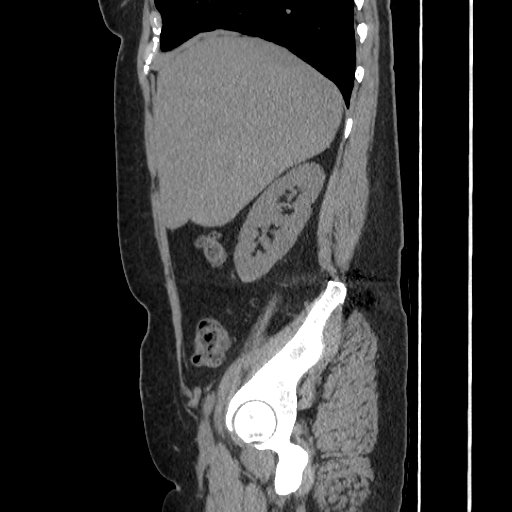
[im 93/208  soft-tissue]
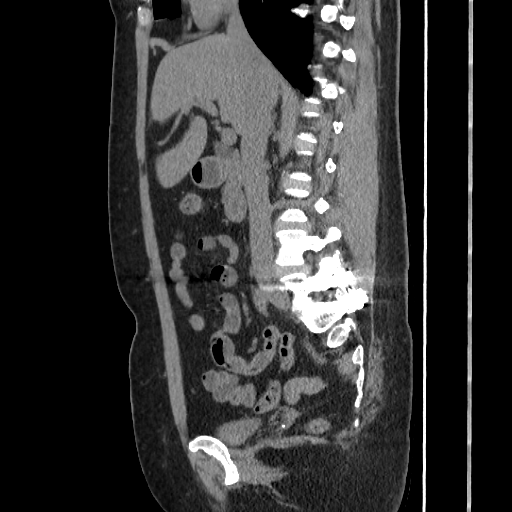
[im 116/208  soft-tissue]
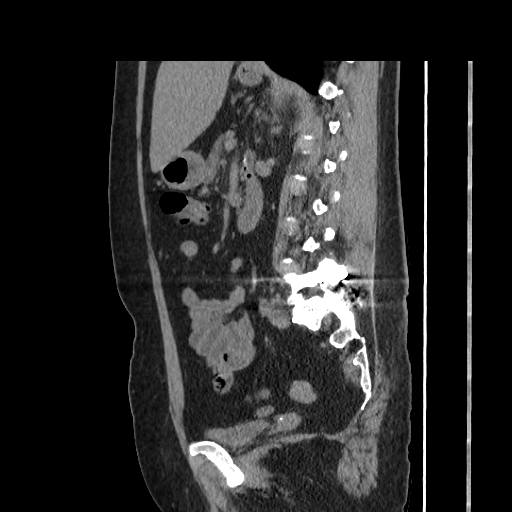

[16 of 46 positions shown; findings below may reference images not displayed]

FINDINGS: Lung Bases: Unremarkable.
Heart: Unremarkable.
Distal Esophagus and Stomach: Unremarkable.
Small and Large Bowel: No small bowel obstruction. Normal appendix. Mild thickening of the descending and sigmoid colon which is probably due to nondistention.
Liver/Biliary: The gallbladder is surgically absent. No significant biliary dilation. Hepatomegaly with no focal hepatic lesions.
Spleen: Unremarkable.
Pancreas: Unremarkable.
Adrenal Glands: Unremarkable.
Kidneys: No urolithiasis or hydronephrosis.
Bladder: Unremarkable.
Reproductive: The uterus is surgically absent. No adnexal masses.
Vascular: Normal caliber of the aorta. Mild atherosclerosis.
Lymph Nodes: No suspicious lymphadenopathy. Partially calcified soft tissue density in the left mid abdominal mesentery measuring 1.2 x 2.6 cm (axial image 67). There appears to be mild surrounding scarring on coronal images (for example coronal images 92-93).
Soft Tissues: Unremarkable.
Bones: No suspicious lytic or blastic lesions. Posterior instrumented and interbody fusion at L5-S1 with chronic pars defects and laminectomy changes.
IMPRESSION: 
IMPRESSION: 1. No urolithiasis or hydronephrosis.
2. Partially calcified soft tissue density in the left mid abdominal mesentery measuring 1.2 x 2.6 cm with mild adjacent scarring. Differential diagnosis includes a small carcinoid tumor.
3. Hepatomegaly.
All CT scans at this facility use iterative reconstruction technique, dose modulation, and/or weight based dosing when appropriate to reduce radiation dose to as low as reasonably achievable.
Is the patient pregnant?
No

## 2023-11-19 IMAGING — DX XR CHEST 1 VIEW
1 series · 1 of 1 positions shown · non-contrast
Comparison: 05/07/2023.

Pt states she has a cough and a blood clot in her legs and was scared something broke off from the clot and is making it hard for her to breathe
FINAL REPORT:
EXAM:
CR Chest, 1  View.
CLINICAL HISTORY: Pt states she has a cough and a blood clot in her legs and was scared something broke off from the clot and is making it hard for her to breathe CP

[AP]
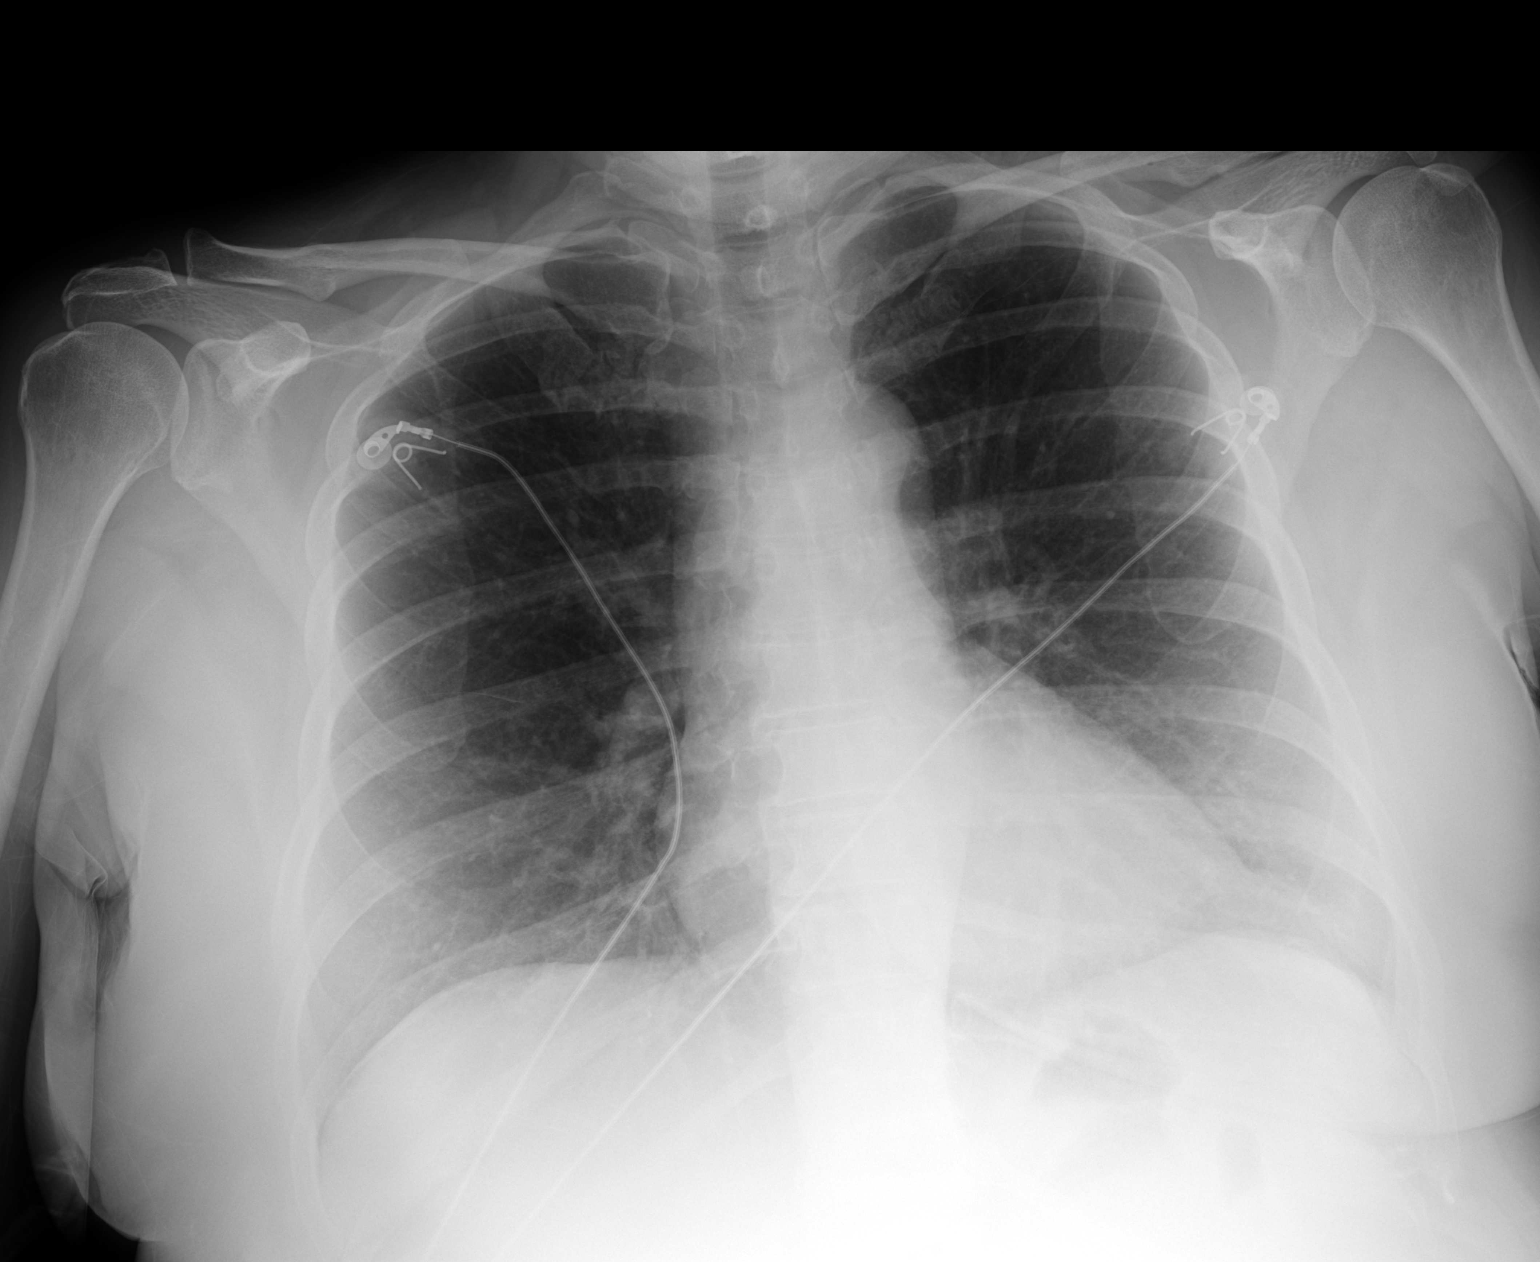

[1 of 1 positions shown; findings below may reference images not displayed]

FINDINGS: LUNGS:
Lungs appear clear
PLEURAL SPACES:
No evidence of pleural effusion or pneumothorax.
MEDIASTINUM:
Cardiac size and mediastinal contours within normal limits.
BONES:
No acute abnormality.
IMPRESSION: No acute cardiopulmonary pathology is evident.
Is the patient pregnant?
No

## 2023-11-19 IMAGING — CT CT CHEST PULMONARY EMBOLISM WITH IV CONTRAST
2 of 7 series · 18 of 36 positions shown · non-contrast
Comparison: None provided.
COMPARISON: None provided.

------------- REPORT [id number] -------------
Pt stated she has blood clot in her leg. PT states she has a cough and SOB
FINAL REPORT:
EXAM:
CTA Chest with Intravenous Contrast for PE evaluation
CLINICAL HISTORY: Pt stated she has blood clot in her leg. PT states she has a cough and SOB Pulmonary embolism (PE) suspected, high prob; Recent diagnosis of peroneal DVT, started on Xarelto, developing dry cough and chest discomfort
TECHNIQUE: Axial CTA images of the chest with intravenous contrast using a pulmonary embolism protocol. Multiplanar reconstructed images were created and reviewed. 3-D MIP reformatted images also reviewed.
All CT scans at this facility use dose modulation, iterative reconstruction, and/or weight-based dosing when appropriate to reduce radiation dose to as low as reasonably achievable.

[Series 4: pe chest thin · axial · 0.75mm/px · z∈[-276,+27]mm · 15 of 552 slices shown]
[im 33/552  lung]
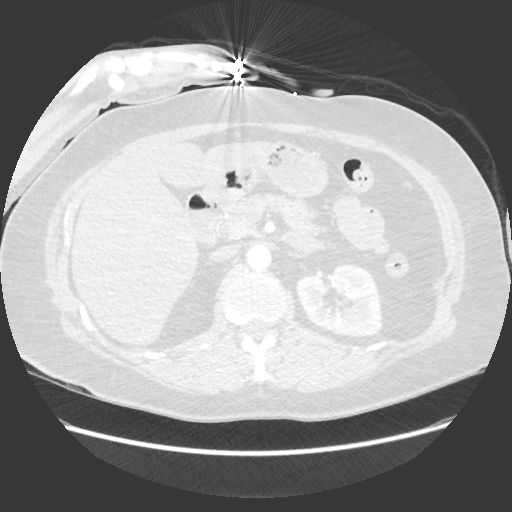
[im 65/552  mediastinal]
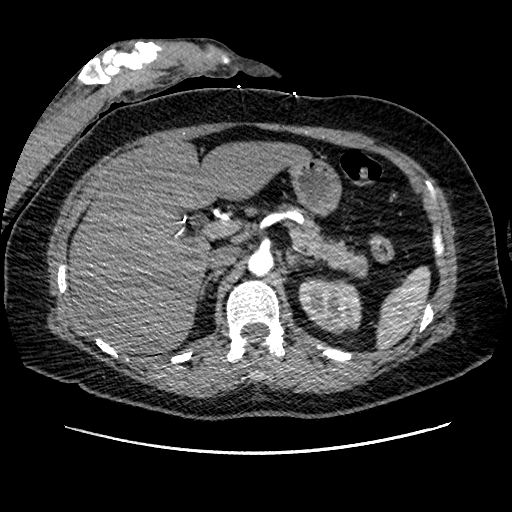
[im 98/552  lung]
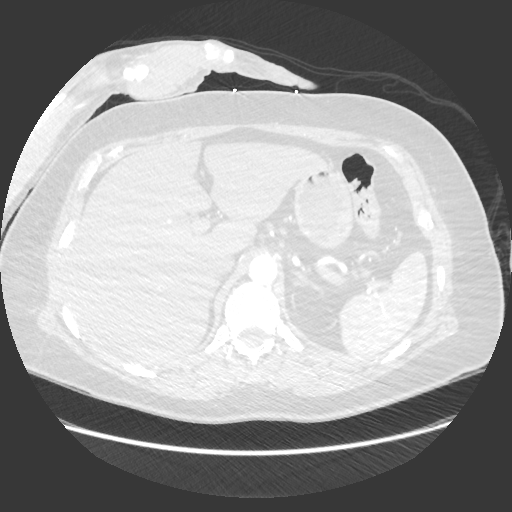
[im 130/552  mediastinal]
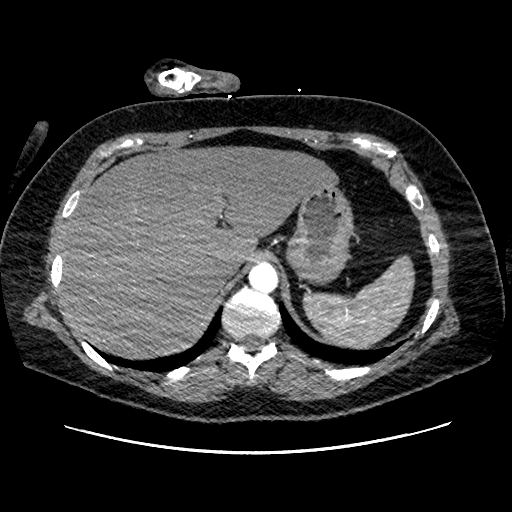
[im 163/552  lung]
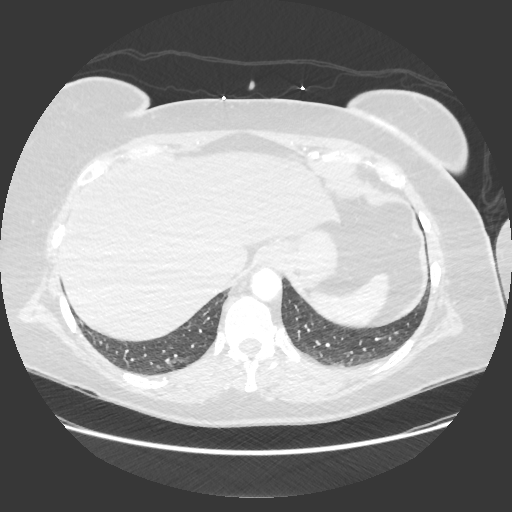
[im 195/552  mediastinal]
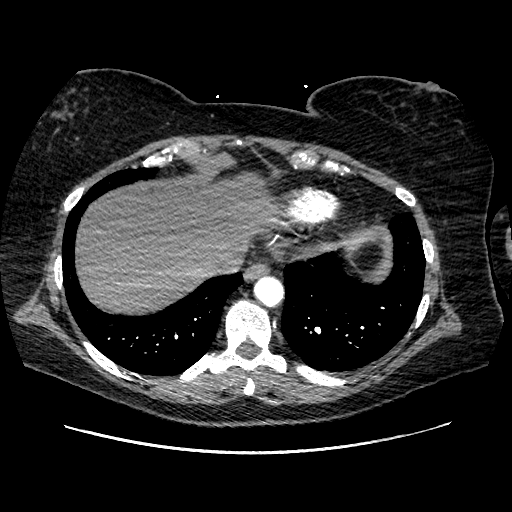
[im 227/552  lung]
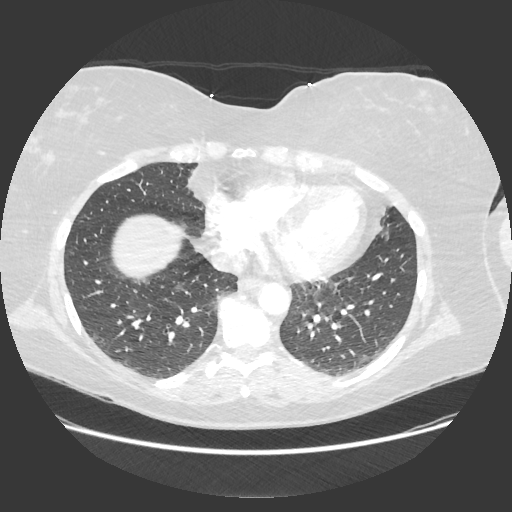
[im 292/552  mediastinal]
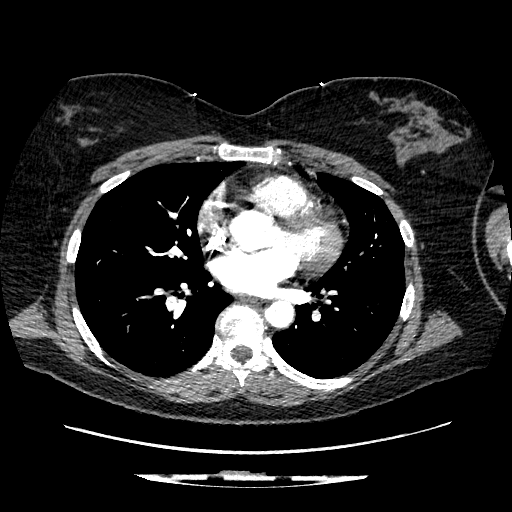
[im 325/552  lung]
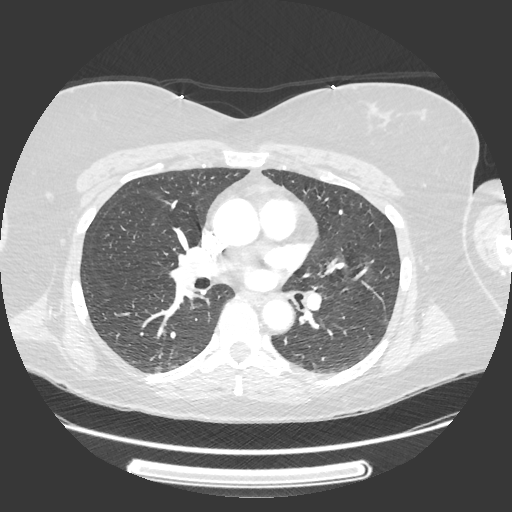
[im 357/552  mediastinal]
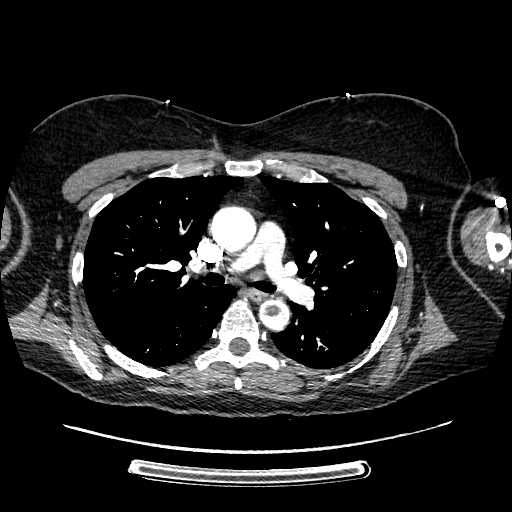
[im 389/552  lung]
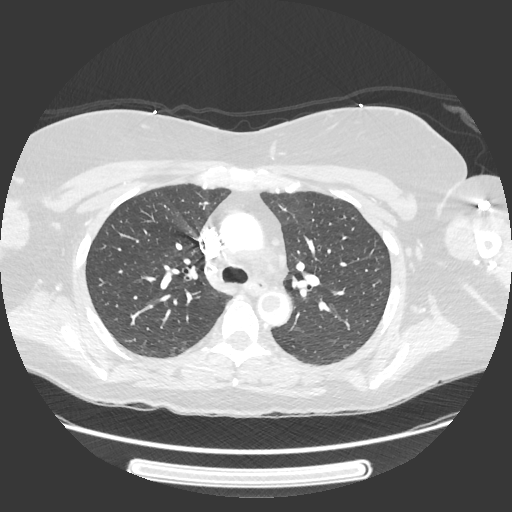
[im 422/552  mediastinal]
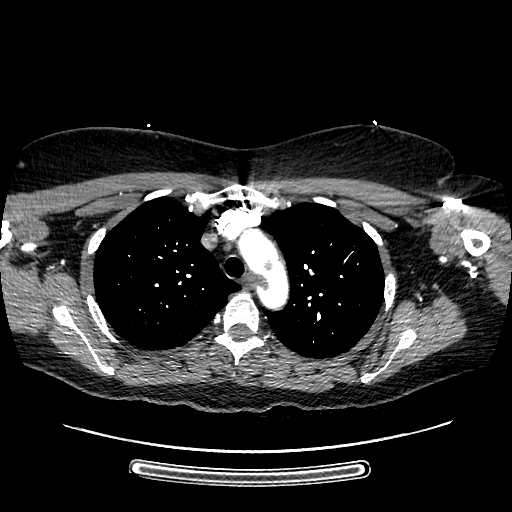
[im 454/552  lung]
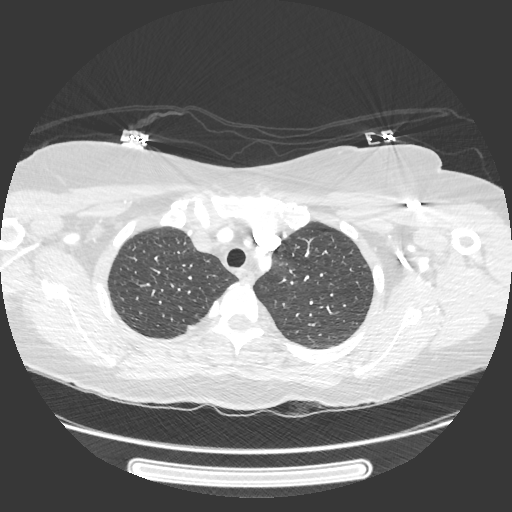
[im 487/552  mediastinal]
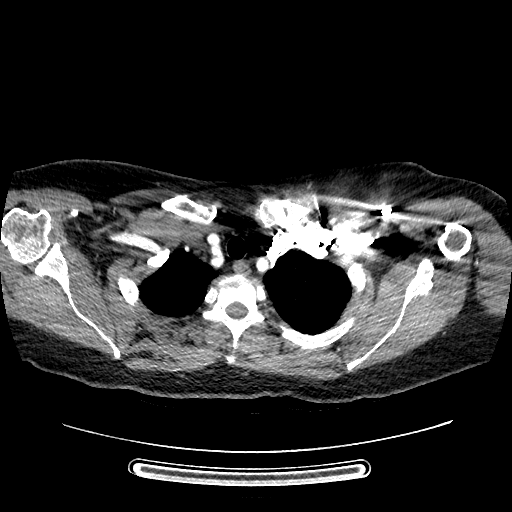
[im 519/552  lung]
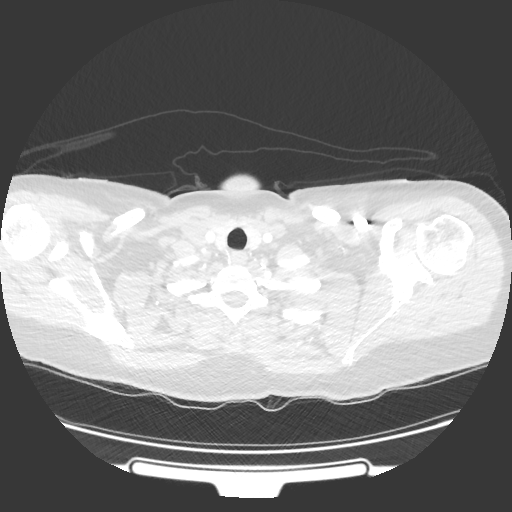

[Series 601: sag standard 2x2 · sagittal · 0.75mm/px · 3 of 194 slices shown]
[im 39/194  mediastinal]
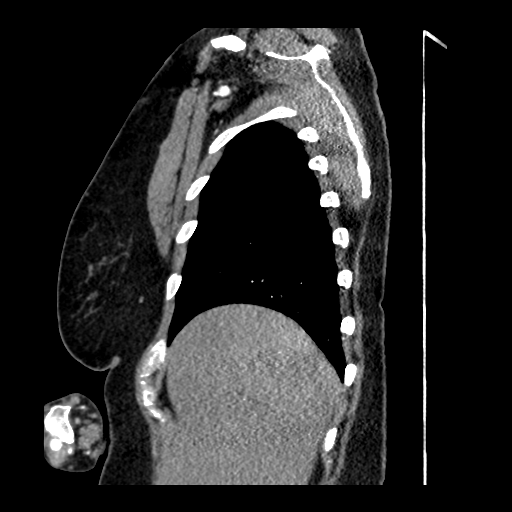
[im 78/194  mediastinal]
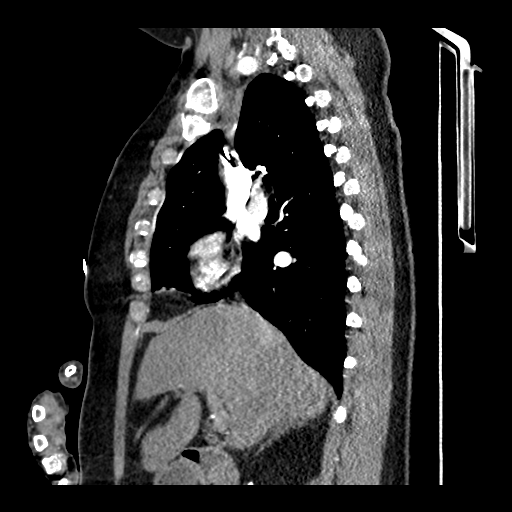
[im 116/194  mediastinal]
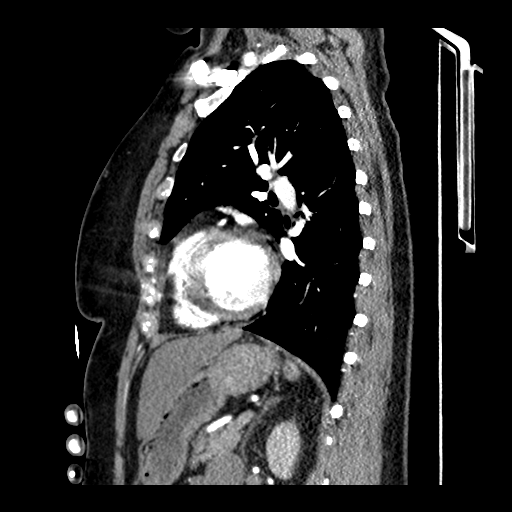

[18 of 36 positions shown; findings below may reference images not displayed]

FINDINGS: PULMONARY ARTERIES:
Large amount of thrombus versus embolism in the mid aortic arch encroaching into the descending aorta. Aorta is normal in caliber without dissection.
AORTA:
There is no evidence for aneurysm or dissection of the thoracic aorta. This causes a proximally 50% aortic stenosis of the arch.
LUNGS:
Minimal atelectatic changes in the lungs. No focal consolidation. Mild thoracic spondylosis.
PLEURAL SPACES:
No pleural effusion seen. No pneumothorax evident.
HEART:
Heart size is within normal limits. No significant pericardial effusion. There are no significant calcifications in the coronary arteries.
LYMPH NODES:
No lymphadenopathy is evident.
BONES:
No focal osseous abnormality or acute fracture.
UPPER ABDOMEN:
Cholecystectomy.
IMPRESSION: Large amount of thrombus versus embolism in the mid aortic arch encroaching into the descending aorta. This causes approximately 50% stenosis of the arch.
No acute pulmonary embolism.
Is the patient pregnant?
No

------------- REPORT [id number] -------------
Pt stated she has blood clot in her leg. PT states she has a cough and SOB
FINAL REPORT:
EXAM:
CTA Chest with Intravenous Contrast for PE evaluation
FINDINGS: PULMONARY ARTERIES:
Large amount of thrombus versus embolism in the mid aortic arch encroaching into the descending aorta. Aorta is normal in caliber without dissection.
AORTA:
There is no evidence for aneurysm or dissection of the thoracic aorta. This causes a proximally 50% aortic stenosis of the arch.
LUNGS:
Minimal atelectatic changes in the lungs. No focal consolidation. Mild thoracic spondylosis.
PLEURAL SPACES:
No pleural effusion seen. No pneumothorax evident.
HEART:
Heart size is within normal limits. No significant pericardial effusion. There are no significant calcifications in the coronary arteries.
LYMPH NODES:
No lymphadenopathy is evident.
BONES:
No focal osseous abnormality or acute fracture.
UPPER ABDOMEN:
Cholecystectomy.
IMPRESSION: Large amount of thrombus versus embolism in the mid aortic arch encroaching into the descending aorta. This causes approximately 50% stenosis of the arch.
No acute pulmonary embolism.
Is the patient pregnant?
No
